# Patient Record
Sex: Male | Born: 1975 | Race: White | Hispanic: No | Marital: Married | Smoking: Current some day smoker
Health system: Southern US, Community
[De-identification: ages and names within clinical notes are randomized; demographics above are authoritative.]

## PROBLEM LIST (undated history)

## (undated) DIAGNOSIS — F3160 Bipolar disorder, current episode mixed, unspecified: Secondary | ICD-10-CM

## (undated) DIAGNOSIS — K219 Gastro-esophageal reflux disease without esophagitis: Secondary | ICD-10-CM

## (undated) DIAGNOSIS — N200 Calculus of kidney: Secondary | ICD-10-CM

## (undated) DIAGNOSIS — K259 Gastric ulcer, unspecified as acute or chronic, without hemorrhage or perforation: Secondary | ICD-10-CM

## (undated) DIAGNOSIS — J449 Chronic obstructive pulmonary disease, unspecified: Secondary | ICD-10-CM

## (undated) DIAGNOSIS — M1990 Unspecified osteoarthritis, unspecified site: Secondary | ICD-10-CM

## (undated) HISTORY — DX: Gastro-esophageal reflux disease without esophagitis: K21.9

## (undated) HISTORY — DX: Calculus of kidney: N20.0

## (undated) HISTORY — PX: OTHER SURGICAL HISTORY: SHX169

## (undated) HISTORY — DX: Unspecified osteoarthritis, unspecified site: M19.90

## (undated) HISTORY — DX: Bipolar disorder, current episode mixed, unspecified: F31.60

## (undated) HISTORY — DX: Gastric ulcer, unspecified as acute or chronic, without hemorrhage or perforation: K25.9

## (undated) HISTORY — DX: Chronic obstructive pulmonary disease, unspecified: J44.9

---

## 2003-11-26 DIAGNOSIS — F3160 Bipolar disorder, current episode mixed, unspecified: Secondary | ICD-10-CM

## 2003-11-26 HISTORY — DX: Bipolar disorder, current episode mixed, unspecified: F31.60

## 2012-04-27 ENCOUNTER — Ambulatory Visit (INDEPENDENT_AMBULATORY_CARE_PROVIDER_SITE_OTHER): Payer: Medicare Other | Admitting: Physician Assistant

## 2012-04-27 ENCOUNTER — Encounter: Payer: Self-pay | Admitting: Physician Assistant

## 2012-04-27 VITALS — BP 141/86 | HR 67 | Temp 98.6°F | Ht 69.5 in | Wt 124.0 lb

## 2012-04-27 DIAGNOSIS — M1990 Unspecified osteoarthritis, unspecified site: Secondary | ICD-10-CM

## 2012-04-27 DIAGNOSIS — R0602 Shortness of breath: Secondary | ICD-10-CM

## 2012-04-27 DIAGNOSIS — K219 Gastro-esophageal reflux disease without esophagitis: Secondary | ICD-10-CM

## 2012-04-27 DIAGNOSIS — R0789 Other chest pain: Secondary | ICD-10-CM

## 2012-04-27 DIAGNOSIS — M129 Arthropathy, unspecified: Secondary | ICD-10-CM

## 2012-04-27 MED ORDER — OMEPRAZOLE 20 MG PO CPDR: 20.0000 mg | DELAYED_RELEASE_CAPSULE | Freq: Every day | ORAL | Status: DC

## 2012-04-27 MED ORDER — ALBUTEROL SULFATE HFA 108 (90 BASE) MCG/ACT IN AERS: 2.0000 | INHALATION_SPRAY | Freq: Four times a day (QID) | RESPIRATORY_TRACT | Status: DC | PRN

## 2012-04-27 NOTE — Patient Instructions (Addendum)
Refilled rescue inhaler and prilosec. Will schedule for stress test. Restart Gabapentin for nerve pain. Schedule an appt for spirometry. Reconsider going back on medications for bipolar. Consider tylenol for pain along with ice. Glucosamine Chondrotin for joint pain. Pain management referral.

## 2012-04-28 ENCOUNTER — Encounter: Payer: Self-pay | Admitting: Physician Assistant

## 2012-04-28 DIAGNOSIS — M1990 Unspecified osteoarthritis, unspecified site: Secondary | ICD-10-CM | POA: Insufficient documentation

## 2012-04-28 DIAGNOSIS — K219 Gastro-esophageal reflux disease without esophagitis: Secondary | ICD-10-CM | POA: Insufficient documentation
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
## 2012-05-01 ENCOUNTER — Encounter: Payer: Self-pay | Admitting: *Deleted

## 2012-05-04 ENCOUNTER — Other Ambulatory Visit: Payer: Medicare Other | Admitting: Physician Assistant

## 2012-05-04 ENCOUNTER — Encounter: Payer: Self-pay | Admitting: Physician Assistant

## 2012-05-04 ENCOUNTER — Ambulatory Visit (INDEPENDENT_AMBULATORY_CARE_PROVIDER_SITE_OTHER): Payer: Medicare Other | Admitting: Physician Assistant

## 2012-05-04 VITALS — BP 138/80 | HR 61 | Ht 69.5 in | Wt 125.0 lb

## 2012-05-04 DIAGNOSIS — K219 Gastro-esophageal reflux disease without esophagitis: Secondary | ICD-10-CM

## 2012-05-04 DIAGNOSIS — R0602 Shortness of breath: Secondary | ICD-10-CM

## 2012-05-04 DIAGNOSIS — M6289 Other specified disorders of muscle: Secondary | ICD-10-CM

## 2012-05-04 DIAGNOSIS — Z23 Encounter for immunization: Secondary | ICD-10-CM

## 2012-05-04 DIAGNOSIS — F411 Generalized anxiety disorder: Secondary | ICD-10-CM

## 2012-05-04 DIAGNOSIS — F17200 Nicotine dependence, unspecified, uncomplicated: Secondary | ICD-10-CM

## 2012-05-04 DIAGNOSIS — R0789 Other chest pain: Secondary | ICD-10-CM

## 2012-05-04 DIAGNOSIS — F419 Anxiety disorder, unspecified: Secondary | ICD-10-CM

## 2012-05-04 LAB — PULMONARY FUNCTION TEST

## 2012-05-04 MED ORDER — OMEPRAZOLE 40 MG PO CPDR: 40.0000 mg | DELAYED_RELEASE_CAPSULE | Freq: Every day | ORAL | Status: DC

## 2012-05-04 MED ORDER — CYCLOBENZAPRINE HCL 10 MG PO TABS: 10.0000 mg | ORAL_TABLET | Freq: Every evening | ORAL | Status: AC | PRN

## 2012-05-04 MED ORDER — SERTRALINE HCL 50 MG PO TABS: 50.0000 mg | ORAL_TABLET | Freq: Every day | ORAL | Status: DC

## 2012-05-04 NOTE — Patient Instructions (Addendum)
Will call with stress test. Start Zoloft 50mg  1/2 tab for 7 days then increase to 1 tab daily. Gave flexeril for muscle relaxation to take a bedtime.  Gave Tdap. Will wait on labs until we get most recent labs from [facility] or you bring [place] copy to scan in. We needs these or we will need to order new labs.  Go ahead a stop Gabapentin since not helping pain and making chest pain worse.
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
## 2012-05-06 ENCOUNTER — Other Ambulatory Visit: Payer: Self-pay | Admitting: Physician Assistant

## 2012-05-06 MED ORDER — OMEPRAZOLE 20 MG PO CPDR: 20.0000 mg | DELAYED_RELEASE_CAPSULE | Freq: Two times a day (BID) | ORAL | Status: DC

## 2012-05-06 NOTE — Progress Notes (Signed)
Let patient know that insurance wanted 20mg  twice a day instead of 40mg  daily. Sent to pharmacy.

## 2012-05-14 ENCOUNTER — Encounter: Payer: Self-pay | Admitting: Physician Assistant

## 2012-05-22 ENCOUNTER — Ambulatory Visit (INDEPENDENT_AMBULATORY_CARE_PROVIDER_SITE_OTHER): Payer: Medicare Other | Admitting: Physician Assistant

## 2012-05-22 ENCOUNTER — Encounter: Payer: Self-pay | Admitting: Physician Assistant

## 2012-05-22 DIAGNOSIS — R0789 Other chest pain: Secondary | ICD-10-CM

## 2012-05-22 NOTE — Procedures (Signed)
Manuel Cortez. is a 36 y.o. male smoker with a strong FHx of CAD (dad with MI in 58s) referred by PCP for ETT.  Has had atypical CP for 2 years.  Exam unremarkable.     Exercise Treadmill Test  Pre-Exercise Testing Evaluation Rhythm: normal sinus  Rate: 65   PR:  .13 QRS:  .09  QT:  .40 QTc: .41     Test  Exercise Tolerance Test Ordering MD: Tandy Gaw , PA-C  Interpreting MD: Tereso Newcomer PA-C  Unique Test No: 1  Treadmill:  1  Indication for ETT: chest pain - rule out ischemia  Contraindication to ETT: No   Stress Modality: exercise - treadmill  Cardiac Imaging Performed: non   Protocol: standard Bruce - maximal  Max BP: 164/92  Max MPHR (bpm):  183 85% MPR (bpm):  155  MPHR obtained (bpm):  109 % MPHR obtained:  61%  Reached 85% MPHR (min:sec):  n/a Total Exercise Time (min-sec):  6:47  Workload in METS:  8.1 Borg Scale: 13  Reason ETT Terminated:  patient's desire to stop    ST Segment Analysis At Rest: normal ST segments - no evidence of significant ST depression With Exercise: no evidence of significant ST depression  Other Information Arrhythmia:  No Angina during ETT:  absent (0) Quality of ETT:  non-diagnostic  ETT Interpretation:  Patient unable to achieve target heart rate.  Comments: Poor exercise tolerance. No chest pain. Normal BP response to exercise. No ST-T changes at submaximal exercise.   Recommendations: Patient complained of back pain and was also unable to stay on treadmill safely during more advanced stages. This test is non-diagnostic. Would suggest stress imaging (Dobutamine Echo or Lexiscan Myoview) if felt to be clinically indicated. Tereso Newcomer, PA-C  9:18 AM 05/22/2012

## 2012-05-25 ENCOUNTER — Other Ambulatory Visit: Payer: Self-pay | Admitting: Physician Assistant

## 2012-05-25 DIAGNOSIS — R079 Chest pain, unspecified: Secondary | ICD-10-CM

## 2012-06-10 ENCOUNTER — Ambulatory Visit (INDEPENDENT_AMBULATORY_CARE_PROVIDER_SITE_OTHER): Payer: Medicare Other | Admitting: Physician Assistant

## 2012-06-10 ENCOUNTER — Encounter: Payer: Self-pay | Admitting: Physician Assistant

## 2012-06-10 VITALS — BP 139/81 | HR 68 | Ht 69.5 in | Wt 123.0 lb

## 2012-06-10 DIAGNOSIS — M2550 Pain in unspecified joint: Secondary | ICD-10-CM

## 2012-06-10 DIAGNOSIS — R0789 Other chest pain: Secondary | ICD-10-CM

## 2012-06-10 DIAGNOSIS — F411 Generalized anxiety disorder: Secondary | ICD-10-CM

## 2012-06-10 MED ORDER — ESCITALOPRAM OXALATE 10 MG PO TABS: 10.0000 mg | ORAL_TABLET | Freq: Every day | ORAL | Status: DC

## 2012-06-10 MED ORDER — HYDROXYZINE HCL 25 MG PO TABS: 25.0000 mg | ORAL_TABLET | Freq: Three times a day (TID) | ORAL | Status: DC | PRN

## 2012-06-10 MED ORDER — DULOXETINE HCL 30 MG PO CPEP: 30.0000 mg | ORAL_CAPSULE | Freq: Every day | ORAL | Status: DC

## 2012-06-10 NOTE — Patient Instructions (Addendum)
Do not pick up Lexapro.   Pick up cymbalta 30 mg daily. Follow up in 1 month. Try hydroxine when chest pain starts and see if helps calm it down can use up to three times a day.  Will refer to cardiology.

## 2012-06-11 LAB — RHEUMATOID FACTOR: Rhuematoid fact SerPl-aCnc: <10 IU/mL (ref <=14)

## 2012-06-11 LAB — SEDIMENTATION RATE: Sed Rate: 1 mm/hr (ref 0–16)

## 2012-06-12 NOTE — Progress Notes (Signed)
  Subjective:    Patient ID: Manuel Cortez., male    DOB: 07-Jun-1976, 36 y.o.   MRN: [id number]  HPI Pt presents to clinic to follow up on anxiety/atypical chest pains. He stopped taking Zoloft because he didn't like the way it made him feel. Stress test was attempted but patient unable to reach target heart rate. His lungs have been fully evaluated and normal. Pt continues to complain of stabbing pain on the left side of chest with SOB at rest and with exertion. The patient reports the only thing that helps it is alcohol. Denies any numbness or tingling of arms.    Review of Systems     Objective:   Physical Exam  Constitutional: He is oriented to person, place, and time. He appears well-developed and well-nourished.  HENT:  Head: Normocephalic and atraumatic.  Cardiovascular: Normal rate, regular rhythm and normal heart sounds.   Pulmonary/Chest: Effort normal and breath sounds normal. He has no wheezes.  Neurological: He is alert and oriented to person, place, and time.  Skin: Skin is warm and dry.  Psychiatric:       Anxious. States the same thing over and over.          Assessment & Plan:  Anxiety/Atypical Chest pains- I do think this is related to his anxiety. I will refer to cardiologist to see if he would suggest that we do anything else to evaluate his heart. I do not want to give benzo because I think he might have a problem with the additive component of them hence he will not give up marijuana to go to pain clinic. I did give Hydroxyzine for anxiety up to three times a day and cymbalta 30mg  daily. I orginally wanted to try lexapro but reminded patient not to pick that up from pharmacy I only wanted him on cymbalta. Will recheck in 6 weeks. Reassured patient that if everything check out at cardiology that we really needed to focus on mood/anxiety.

## 2012-06-16 ENCOUNTER — Encounter: Payer: Self-pay | Admitting: Family Medicine

## 2012-06-16 ENCOUNTER — Ambulatory Visit (INDEPENDENT_AMBULATORY_CARE_PROVIDER_SITE_OTHER): Payer: Medicare Other | Admitting: Family Medicine

## 2012-06-16 VITALS — BP 124/78 | HR 68 | Temp 98.0°F | Wt 126.0 lb

## 2012-06-16 DIAGNOSIS — M549 Dorsalgia, unspecified: Secondary | ICD-10-CM

## 2012-06-16 DIAGNOSIS — M546 Pain in thoracic spine: Secondary | ICD-10-CM

## 2012-06-16 DIAGNOSIS — F319 Bipolar disorder, unspecified: Secondary | ICD-10-CM

## 2012-06-16 NOTE — Progress Notes (Signed)
  Subjective:    Patient ID: Claretha Cooper., male    DOB: [birth year], 36 y.o.   MRN: [id number]  HPI Says was in the pool with the kids yesterday.  Woke up with left mid back pain. Can walk ok.  Pain is 8/10.  Worse with bending to the side.  Feels like a pressure and tightness.  Has had back pain before. No radiation of pain.  Took a half a vicodin for pain relief.  ( not his rx). Says NSAID cause stomach burning. Says he is no longer taking the cymbalta.    Review of Systems     Objective:   Physical Exam  Constitutional: He appears well-developed and well-nourished.  Musculoskeletal:       Dec flexion, normal extension, normal rotattion right and left.  Pain with side benign to the right.   Skin: Skin is warm and dry.  Psychiatric: He has a normal mood and affect. His behavior is normal.          Assessment & Plan:  Left mid back pain - Can use his volteran gel since has sensitivity to oral NSAIDs.  Offered toradol injection but he declined.  Can use oral Tylenol. Use heating pad for 10-15 at a time for muscle  Relaxtion.  Consider restarting the cymbalta since intoleratn to volteran. Given H.O for stretches.  Avoid laying in bed all day.  F/U in 3-4 weeks if needed.

## 2012-06-16 NOTE — Patient Instructions (Signed)
Mid-Back Strain  with Rehab    A strain is an injury in which a tendon or muscle is torn. The muscles and tendons of the mid-back are vulnverable to strains. However, these muscles and tendons are very strong and require a great force to be injured. The muscles of the mid-back are responsible for stabilizing the spinal column, as well as spinal twisting (rotation). Strains are classified into three categories. Grade 1 strains cause pain, but the tendon is not lengthened. Grade 2 strains include a lengthened ligament, due to the ligament being stretched or partially ruptured. With grade 2 strains there is still function, although the function may be decreased. Grade 3 strains involve a complete tear of the tendon or muscle, and function is usually impaired.  SYMPTOMS    · Pain in the middle of the back.  · Pain that may affect only one side, and is worse with movement.  · Muscle spasms, and often swelling in the back.  · Loss of strength of the back muscles.  · Crackling sound (crepitation) when the muscles are touched.  CAUSES    Mid-back strains occur when a force is placed on the muscles or tendons that is greater than they can handle. Common causes of injury include:  · Ongoing overuse of the muscle-tendon units in the middle back, usually from incorrect body posture.  · A single violent injury or force applied to the back.  RISK INCREASES WITH:  · Sports that involve twisting forces on the spine or a lot of bending at the waist (football, rugby, weightlifting, bowling, golf, tennis, speed skating, racquetball, swimming, running, gymnastics, diving).  · Poor strength and flexibility.  · Failure to warm up properly before activity.  · Family history of low back pain or disk disorders.  · Previous back injury or surgery (especially fusion).  PREVENTION  · Learn and use proper sports technique.  · Warm up and stretch properly before activity.  · Allow for adequate recovery between workouts.  · Maintain physical  fitness:  · Strength, flexibility, and endurance.  · Cardiovascular fitness.  PROGNOSIS    If treated properly, mid-back strains usually heal within 6 weeks.  RELATED COMPLICATIONS    · Frequently recurring symptoms, resulting in a chronic problem. Properly treating the problem the first time decreases frequency of recurrence.  · Chronic inflammation, scarring, and partial muscle-tendon tear.  · Delayed healing or resolution of symptoms, especially if activity is resumed too soon.  · Prolonged disability.  TREATMENT  Treatment first involves the use of ice and medicine, to reduce pain and inflammation. As the pain begins to subside, you may begin strengthening and stretching exercises to improve body posture and sport technique. These exercises may be performed at home or with a therapist. Severe injuries may require referral to a therapist for further evaluation and treatment, such as ultrasound. Corticosteroid injections may be given to help reduce inflammation. Biofeedback (watching monitors of your body processes) and psychotherapy may also be prescribed. Prolonged bed rest is felt to do more harm than good. Massage may help break the muscle spasms. Sometimes, an injection of cortisone, with or without local anesthetics, may be given to help relieve the pain and spasms.  MEDICATION    · If pain medicine is needed, nonsteroidal anti-inflammatory medicines (aspirin and ibuprofen), or other minor pain relievers (acetaminophen), are often advised.  · Do not take pain medicine for 7 days before surgery.  · Prescription pain relievers may be given, if your caregiver   thinks they are needed. Use only as directed and only as much as you need.  · Ointments applied to the skin may be helpful.  · Corticosteroid injections may be given by your caregiver. These injections should be reserved for the most serious cases, because they may only be given a certain number of times.  HEAT AND COLD:    · Cold treatment (icing) should  be applied for 10 to 15 minutes every 2 to 3 hours for inflammation and pain, and immediately after activity that aggravates your symptoms. Use ice packs or an ice massage.  · Heat treatment may be used before performing stretching and strengthening activities prescribed by your caregiver, physical therapist, or athletic trainer. Use a heat pack or a warm water soak.  SEEK IMMEDIATE MEDICAL CARE IF:  · Symptoms get worse or do not improve in 2 to 4 weeks, despite treatment.  · You develop numbness, weakness, or loss of bowel or bladder function.  · New, unexplained symptoms develop. (Drugs used in treatment may produce side effects.)  EXERCISES  RANGE OF MOTION (ROM) AND STRETCHING EXERCISES - Mid-Back Strain  These exercises may help you when beginning to rehabilitate your injury. In order to successfully resolve your symptoms, you must improve your posture. These exercises are designed to help reduce the forward-head and rounded-shoulder posture which contributes to this condition. Your symptoms may resolve with or without further involvement from your physician, physical therapist or athletic trainer. While completing these exercises, remember:    · Restoring tissue flexibility helps normal motion to return to the joints. This allows healthier, less painful movement and activity.  · An effective stretch should be held for at least 30 seconds.  · A stretch should never be painful. You should only feel a gentle lengthening or release in the stretched tissue.  STRETECH - Axial Extension  · Stand or sit on a firm surface. Assume a good posture: chest up, shoulders drawn back, stomach muscles slightly tense, knees unlocked (if standing) and feet hip width apart.  · Slowly retract your chin, so your head slides back and your chin slightly lowers. Continue to look straight ahead.  · You should feel a gentle stretch in the back of your head. Be certain not to feel an aggressive stretch since this can cause headaches  later.  · Hold for __________ seconds.  Repeat __________ times. Complete this exercise __________ times per day.  RANGE OF MOTION- Upper Thoracic Extension  · Sit on a firm chair with a high back. Assume a good posture: chest up, shoulders drawn back, abdominal muscles slightly tense, and feet hip width apart. Place a small pillow or folded towel in the curve of your lower back, if you are having difficulty maintaining good posture.  · Gently brace your neck with your hands, allowing your arms to rest on your chest.  · Continue to support your neck and slowly extend your back over the chair. You will feel a stretch across your upper back.  · Hold __________ seconds. Slowly return to the starting position.  Repeat __________ times. Complete this exercise __________ times per day.  RANGE OF MOTION- Mid-Thoracic Extension  · Roll a towel so that it is about 4 inches in diameter.  · Position the towel lengthwise. Lay on the towel so that your spine, but not your shoulder blades, are supported.  · You should feel your mid-back arching toward the floor. To increase the stretch, extend your arms away from your body.  ·   you when beginning to rehabilitate your injury. They may resolve your symptoms with or without further involvement from your physician, physical therapist or athletic trainer. While completing these exercises, remember:    Muscles can gain both the endurance and the strength needed for everyday activities through controlled exercises.   Complete these exercises as instructed by your physician, physical therapist or athletic trainer. Increase the resistance and repetitions only as guided by your caregiver.   You may experience muscle soreness or fatigue, but the pain or discomfort you are trying to  eliminate should never worsen during these exercises. If this pain does worsen, stop and make certain you are following the directions exactly. If the pain is still present after adjustments, discontinue the exercise until you can discuss the trouble with your caregiver.  STRENGTHENING - Quadruped, Opposite UE/LE Lift  Assume a hands and knees position on a firm surface. Keep your hands under your shoulders and your knees under your hips. You may place padding under your knees for comfort.   Find your neutral spine and gently tense your abdominal muscles so that you can maintain this position. Your shoulders and hips should form a rectangle that is parallel with the floor and is not twisted.   Keeping your trunk steady, lift your right hand no higher than your shoulder and then your left leg no higher than your hip. Make sure you are not holding your breath. Hold this position __________ seconds.   Continuing to keep your abdominal muscles tense and your back steady, slowly return to your starting position. Repeat with the opposite arm and leg.  Repeat __________ times. Complete this exercise __________ times per day.   STRENGTH - Shoulder Extensors  Secure a rubber exercise band or tubing to a fixed object (table, pole) so that it is at the height of your shoulders when you are either standing, or sitting on a firm armless chair.   With a thumbs-up grip, grasp an end of the band in each hand. Straighten your elbows and lift your hands straight in front of you at shoulder height. Step back away from the secured end of band, until it becomes tense.   Squeezing your shoulder blades together, pull your hands down to the sides of your thighs. Do not allow your hands to go behind you.   Hold for __________ seconds. Slowly ease the tension on the band, as you reverse the directions and return to the starting position.  Repeat __________ times. Complete this exercise __________ times per day.     STRENGTH - Horizontal Abductors Choose one of the two positions to complete this exercise. Prone: lying on stomach:  Lie on your stomach on a firm surface so that your right / left arm overhangs the edge. Rest your forehead on your opposite forearm. With your palm facing the floor and your elbow straight, hold a __________ weight in your hand.   Squeeze your right / left shoulder blade to your mid-back spine and then slowly raise your arm to the height of the bed.   Hold for __________ seconds. Slowly reverse the directions and return to the starting position, controlling the weight as you lower your arm.  Repeat __________ times. Complete this exercise __________ times per day. Standing:   Secure a rubber exercise band or tubing, so that it is at the height of your shoulders when you are either standing, or sitting on a firm armless chair.   Grasp an end of the band in each  hand and have your palms face each other. Straighten your elbows and lift your hands straight in front of you at shoulder height. Step back away from the secured end of band, until it becomes tense.   Squeeze your shoulder blades together. Keeping your elbows locked and your hands at shoulder height, spread your arms apart, forming a "T" shape with your body. Hold __________ seconds. Slowly ease the tension on the band, as you reverse the directions and return to the starting position.  Repeat __________ times. Complete this exercise __________ times per day. STRENGTH - Scapular Retractors and External Rotators, Rowing  Secure a rubber exercise band or tubing, so that it is at the height of your shoulders when you are either standing, or sitting on a firm armless chair.   With a palm-down grip, grasp an end of the band in each hand. Straighten your elbows and lift your hands straight in front of you at shoulder height. Step back away from the secured end of band, until it becomes tense.   Step 1: Squeeze your shoulder  blades together. Bending your elbows, draw your hands to your chest as if you are rowing a boat. At the end of this motion, your hands and elbow should be at shoulder height and your elbows should be out to your sides.   Step 2: Rotate your shoulder to raise your hands above your head. Your forearms should be vertical and your upper arms should be horizontal.   Hold for __________ seconds. Slowly ease the tension on the band, as you reverse the directions and return to the starting position.  Repeat __________ times. Complete this exercise __________ times per day.   POSTURE AND BODY MECHANICS CONSIDERATIONS - Mid-Back Strain Keeping correct posture when sitting, standing or completing your activities will reduce the stress put on different body tissues, allowing injured tissues a chance to heal and limiting painful experiences. The following are general guidelines for improved posture. Your physician or physical therapist will provide you with any instructions specific to your needs. While reading these guidelines, remember:  The exercises prescribed by your provider will help you have the flexibility and strength to maintain correct postures.   The correct posture provides the best environment for your joints to work. All of your joints have less wear and tear when properly supported by a spine with good posture. This means you will experience a healthier, less painful body.   Correct posture must be practiced with all of your activities, especially prolonged sitting and standing. Correct posture is as important when doing repetitive low-stress activities (typing) as it is when doing a single heavy-load activity (lifting).  PROPER SITTING POSTURE In order to minimize stress and discomfort on your spine, you must sit with correct posture. Sitting with good posture should be effortless for a healthy body. Returning to good posture is a gradual process. Many people can work toward this most comfortably  by using various supports until they have the flexibility and strength to maintain this posture on their own. When sitting with proper posture, your ears will fall over your shoulders and your shoulders will fall over your hips. You should use the back of the chair to support your upper back. Your lower back will be in a neutral position, just slightly arched. You may place a small pillow or folded towel at the base of your low back for   support.   When working at a desk, create an environment that supports good, upright  posture. Without extra support, muscles fatigue and lead to excessive strain on joints and other tissues. Keep these recommendations in mind: CHAIR:  A chair should be able to slide under your desk when your back makes contact with the back of the chair. This allows you to work closely.   The chair's height should allow your eyes to be level with the upper part of your monitor and your hands to be slightly lower than your elbows.  BODY POSITION  Your feet should make contact with the floor. If this is not possible, use a foot rest.   Keep your ears over your shoulders. This will reduce stress on your neck and lower back.  INCORRECT SITTING POSTURES If you are feeling tired and unable to assume a healthy sitting posture, do not slouch or slump. This puts excessive strain on your back tissues, causing more damage and pain. Healthier options include:  Using more support, like a lumbar pillow.   Switching tasks to something that requires you to be upright or walking.   Talking a brief walk.   Lying down to rest in a neutral-spine position.  CORRECT STANDING POSTURES Proper standing posture should be assumed with all daily activities, even if they only take a few moments, like when brushing your teeth. As in sitting, your ears should fall over your shoulders and your shoulders should fall over your hips. You should keep a slight tension in your abdominal muscles to brace your  spine. Your tailbone should point down to the ground, not behind your body, resulting in an over-extended swayback posture.   INCORRECT STANDING POSTURES Common incorrect standing postures include a forward head, locked knees, and an excessive swayback. WALKING Walk with an upright posture. Your ears, shoulders and hips should all line-up. CORRECT LIFTING TECHNIQUES DO :   Assume a wide stance. This will provide you more stability and the opportunity to get as close as possible to the object which you are lifting.   Tense your abdominals to brace your spine. Bend at the knees and hips. Keeping your back locked in a neutral-spine position, lift using your leg muscles. Lift with your legs, keeping your back straight.   Test the weight of unknown objects before attempting to lift them.   Try to keep your elbows locked down at your sides in order get the best strength from your shoulders when carrying an object.   Always ask for help when lifting heavy or awkward objects.  INCORRECT LIFTING TECHNIQUES DO NOT:   Lock your knees when lifting, even if it is a small object.   Bend and twist. Pivot at your feet or move your feet when needing to change directions.   Assume that you can safely pick up even a paperclip without proper posture.  Document Released: 11/11/2005 Document Revised: 10/31/2011 Document Reviewed: 02/23/2009 [facility] Patient Information 2012 [place], [place].

## 2012-07-01 ENCOUNTER — Encounter: Payer: Self-pay | Admitting: Cardiology

## 2012-07-01 ENCOUNTER — Ambulatory Visit (INDEPENDENT_AMBULATORY_CARE_PROVIDER_SITE_OTHER): Payer: Medicare Other | Admitting: Cardiology

## 2012-07-01 VITALS — BP 128/82 | HR 93 | Ht 69.0 in | Wt 122.0 lb

## 2012-07-01 DIAGNOSIS — F17200 Nicotine dependence, unspecified, uncomplicated: Secondary | ICD-10-CM

## 2012-07-01 DIAGNOSIS — Z720 Tobacco use: Secondary | ICD-10-CM

## 2012-07-01 DIAGNOSIS — R079 Chest pain, unspecified: Secondary | ICD-10-CM

## 2012-07-01 NOTE — Assessment & Plan Note (Signed)
Patient counseled on discontinuing. 

## 2012-07-01 NOTE — Assessment & Plan Note (Signed)
Chest pain is not consistent with cardiac etiology. Previous exercise treadmill showed no chest pain, normal blood pressure response and no ST changes. Note he did not achieve target heart rate but did exercise for 6 minutes and 47 seconds and reached a heart rate of 155. I do not feel further cardiac evaluation is indicated. Pain may be musculoskeletal. We reviewed lifestyle modification.

## 2012-07-01 NOTE — Patient Instructions (Addendum)
Your physician recommends that you schedule a follow-up appointment in: AS NEEDED  

## 2012-07-01 NOTE — Progress Notes (Signed)
HPI: 36 year old male for evaluation of chest pain. No prior cardiac history. Note patient had an exercise treadmill in June of 2013. This revealed: Poor exercise tolerance, but he did exercise for 6 minutes and 47 seconds. His peak heart rate was 155 with 85% predicted 183. No chest pain. Normal BP response to exercise. No ST-T changes at submaximal exercise. Patient did not achieve target heart rate and therefore study was felt to be nondiagnostic. Patient complains of constant chest pain for 2 years. It is in a point position in the left upper chest radiating to the back. It is described as a stabbing pain or like a rod in him. It increases with stress or smoking a cigarette but has not completely resolved in the past 2 years other than when sleeping. Not pleuritic, positional, exertional or related to food. Also dyspnea on exertion but no orthopnea, PND or pedal edema.  Current Outpatient Prescriptions  Medication Sig Dispense Refill  . albuterol (PROVENTIL HFA) 108 (90 BASE) MCG/ACT inhaler Inhale 2 puffs into the lungs every 6 (six) hours as needed for wheezing.  1 Inhaler  6  . diclofenac sodium (VOLTAREN) 1 % GEL Apply 1 application topically as needed.      Marland Kitchen omeprazole (PRILOSEC) 20 MG capsule Take 1 capsule (20 mg total) by mouth 2 (two) times daily.  60 capsule  6    No Known Allergies  Past Medical History  Diagnosis Date  . GERD (gastroesophageal reflux disease)   . Gastric ulcer 15 years  . Bipolar 1 disorder, mixed 2005    Not on meds.  . Arthritis   . COPD (chronic obstructive pulmonary disease)   . Nephrolithiasis     Past Surgical History  Procedure Date  . Lymph node removal     History   Social History  . Marital Status: Unknown    Spouse Name: N/A    Number of Children: 3  . Years of Education: N/A   Occupational History  .      Disability   Social History Main Topics  . Smoking status: Current Some Day Smoker -- 0.1 packs/day    Types: Cigarettes  .  Smokeless tobacco: Not on file  . Alcohol Use: 3.0 oz/week    5 Cans of beer per week  . Drug Use: Yes    Special: Marijuana  . Sexually Active: Not on file   Other Topics Concern  . Not on file   Social History Narrative  . No narrative on file    Family History  Problem Relation Age of Onset  . Stroke Mother   . Alcohol abuse Father   . Cancer Father   . Heart attack Father     MI in his 30s  . Depression Father   . Diabetes Father   . Hyperlipidemia Father   . Hypertension Father     ROS: multiple complaints including numbness in his extremities, pain in his neck, pain in his back but no fevers or chills, productive cough, hemoptysis, dysphasia, odynophagia, melena, hematochezia, dysuria, hematuria, rash, seizure activity, orthopnea, PND, pedal edema, claudication. Remaining systems are negative.  Physical Exam:  Blood pressure 128/82, pulse 93, height 5\' 9"  (1.753 m), weight 122 lb (55.339 kg).  General:  Well developed/well nourished in NAD Skin warm/dry Patient not depressed No peripheral clubbing Back-normal HEENT-normal/normal eyelids Neck supple/normal carotid upstroke bilaterally; no bruits; no JVD; no thyromegaly chest - CTA/ normal expansion CV - RRR/normal S1 and S2; no murmurs, rubs or  gallops;  PMI nondisplaced Abdomen -NT/ND, no HSM, no mass, + bowel sounds, no bruit 2+ femoral pulses, no bruits Ext-no edema, chords, 2+ DP Neuro-grossly nonfocal  ECG 04/30/2012-sinus bradycardia, cannot rule out prior septal infarct.

## 2012-07-13 ENCOUNTER — Telehealth: Payer: Self-pay | Admitting: *Deleted

## 2012-07-13 NOTE — Telephone Encounter (Signed)
Pt aware and will call back when he is clean

## 2012-07-13 NOTE — Telephone Encounter (Signed)
Remind patient that they will drug screen him regularly and if he is not willing to give up marijuana then we don't need to refer. Make sure he is willing to give up recreational drugs before referral.

## 2012-07-13 NOTE — Telephone Encounter (Signed)
Pt would like referral to pain clinic. Please advise.

## 2012-08-14 ENCOUNTER — Telehealth: Payer: Self-pay | Admitting: *Deleted

## 2012-08-14 NOTE — Telephone Encounter (Signed)
Pt's wife calls stating Pt has stopped smoking mariajuana and is ready for Pain clinic referral.

## 2012-08-14 NOTE — Telephone Encounter (Signed)
Sent for referral to pain clinic. They may need films. I do not have these from [facility] and any imaging since.

## 2012-08-28 ENCOUNTER — Other Ambulatory Visit: Payer: Self-pay | Admitting: Physician Assistant

## 2012-08-28 ENCOUNTER — Encounter: Payer: Self-pay | Admitting: Physician Assistant

## 2012-08-28 ENCOUNTER — Ambulatory Visit (INDEPENDENT_AMBULATORY_CARE_PROVIDER_SITE_OTHER): Payer: Medicare Other

## 2012-08-28 ENCOUNTER — Ambulatory Visit (INDEPENDENT_AMBULATORY_CARE_PROVIDER_SITE_OTHER): Payer: Medicare Other | Admitting: Physician Assistant

## 2012-08-28 DIAGNOSIS — M25569 Pain in unspecified knee: Secondary | ICD-10-CM

## 2012-08-28 DIAGNOSIS — M549 Dorsalgia, unspecified: Secondary | ICD-10-CM

## 2012-08-28 DIAGNOSIS — G8921 Chronic pain due to trauma: Secondary | ICD-10-CM

## 2012-08-28 DIAGNOSIS — M25519 Pain in unspecified shoulder: Secondary | ICD-10-CM

## 2012-08-28 DIAGNOSIS — Z79899 Other long term (current) drug therapy: Secondary | ICD-10-CM

## 2012-08-28 DIAGNOSIS — M542 Cervicalgia: Secondary | ICD-10-CM

## 2012-08-28 DIAGNOSIS — R0789 Other chest pain: Secondary | ICD-10-CM

## 2012-08-28 DIAGNOSIS — R202 Paresthesia of skin: Secondary | ICD-10-CM

## 2012-08-28 DIAGNOSIS — Z13220 Encounter for screening for lipoid disorders: Secondary | ICD-10-CM

## 2012-08-28 DIAGNOSIS — F319 Bipolar disorder, unspecified: Secondary | ICD-10-CM

## 2012-08-28 DIAGNOSIS — M25511 Pain in right shoulder: Secondary | ICD-10-CM

## 2012-08-28 DIAGNOSIS — Z131 Encounter for screening for diabetes mellitus: Secondary | ICD-10-CM

## 2012-08-28 DIAGNOSIS — R209 Unspecified disturbances of skin sensation: Secondary | ICD-10-CM

## 2012-08-28 DIAGNOSIS — R0602 Shortness of breath: Secondary | ICD-10-CM

## 2012-08-28 IMAGING — CR DG SHOULDER 2+V*R*
3 series · 3 of 3 positions shown · non-contrast
Comparison: None.

CLINICAL DATA: Pain

RIGHT SHOULDER - 2+ VIEW

[view not recorded (1 of 3)]
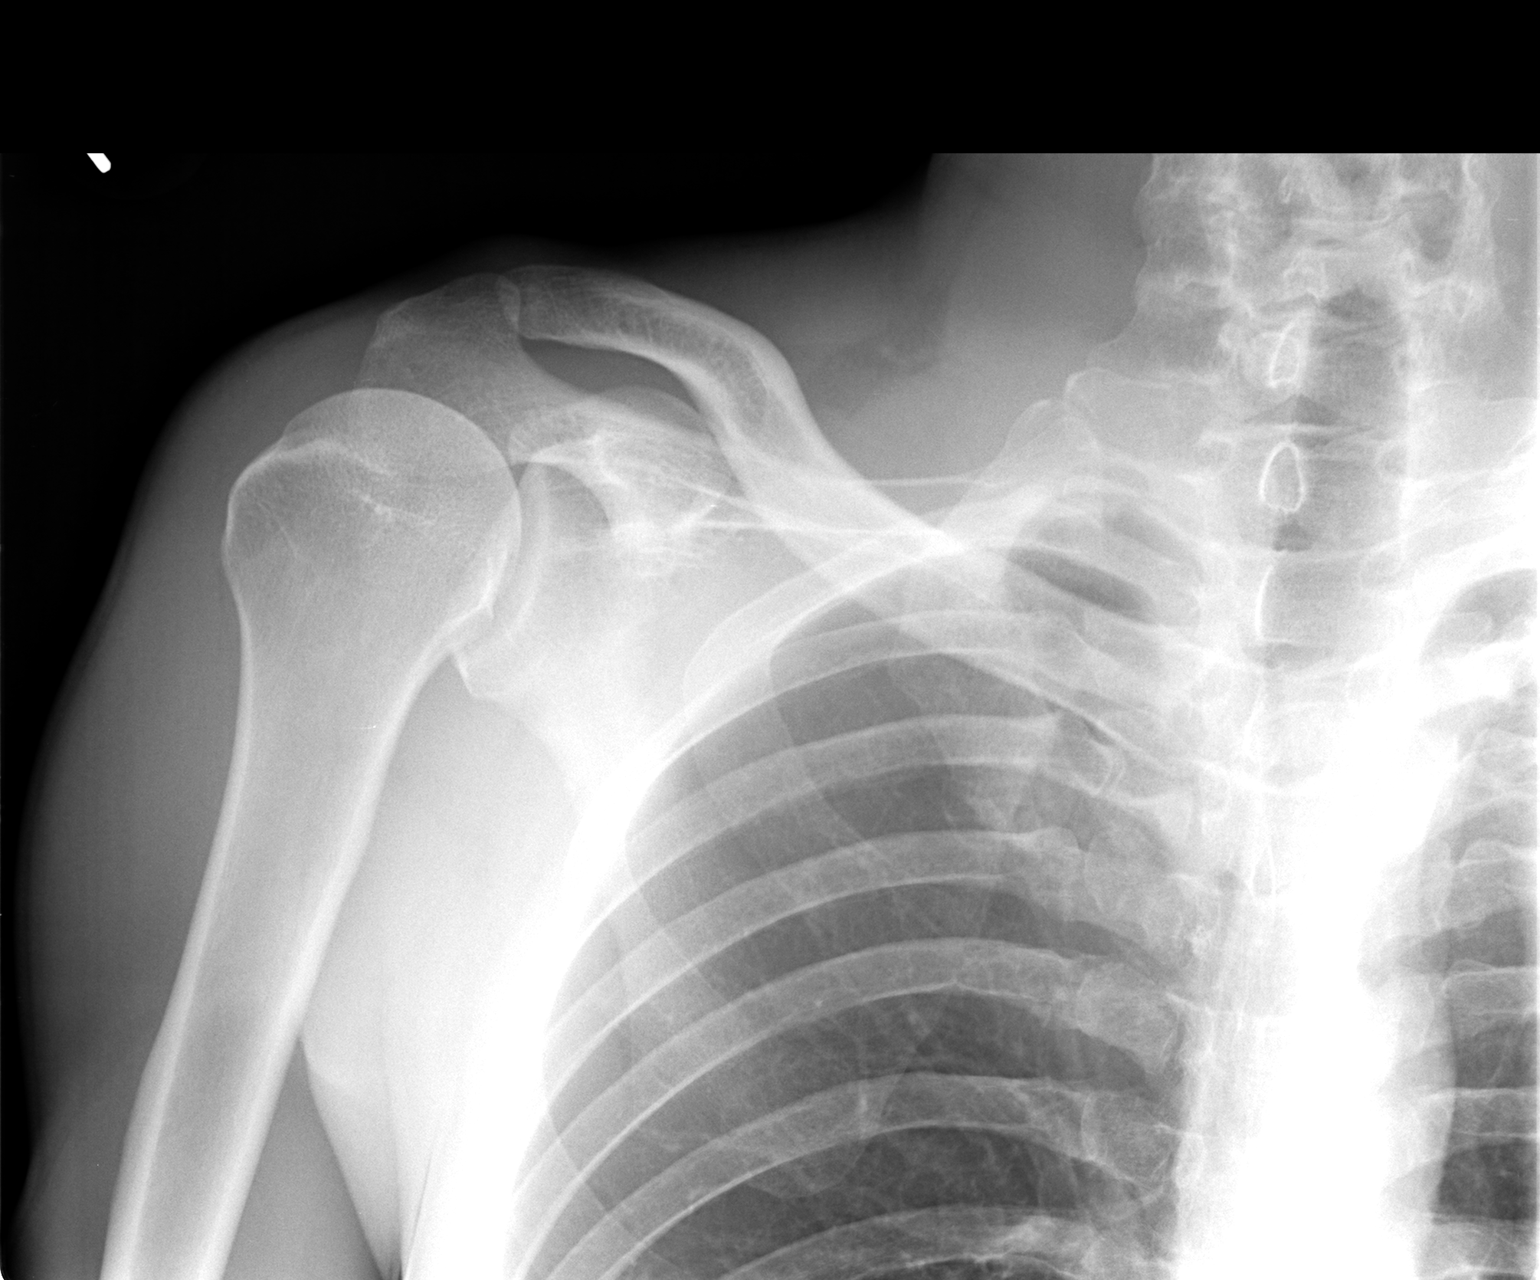

[view not recorded (2 of 3)]
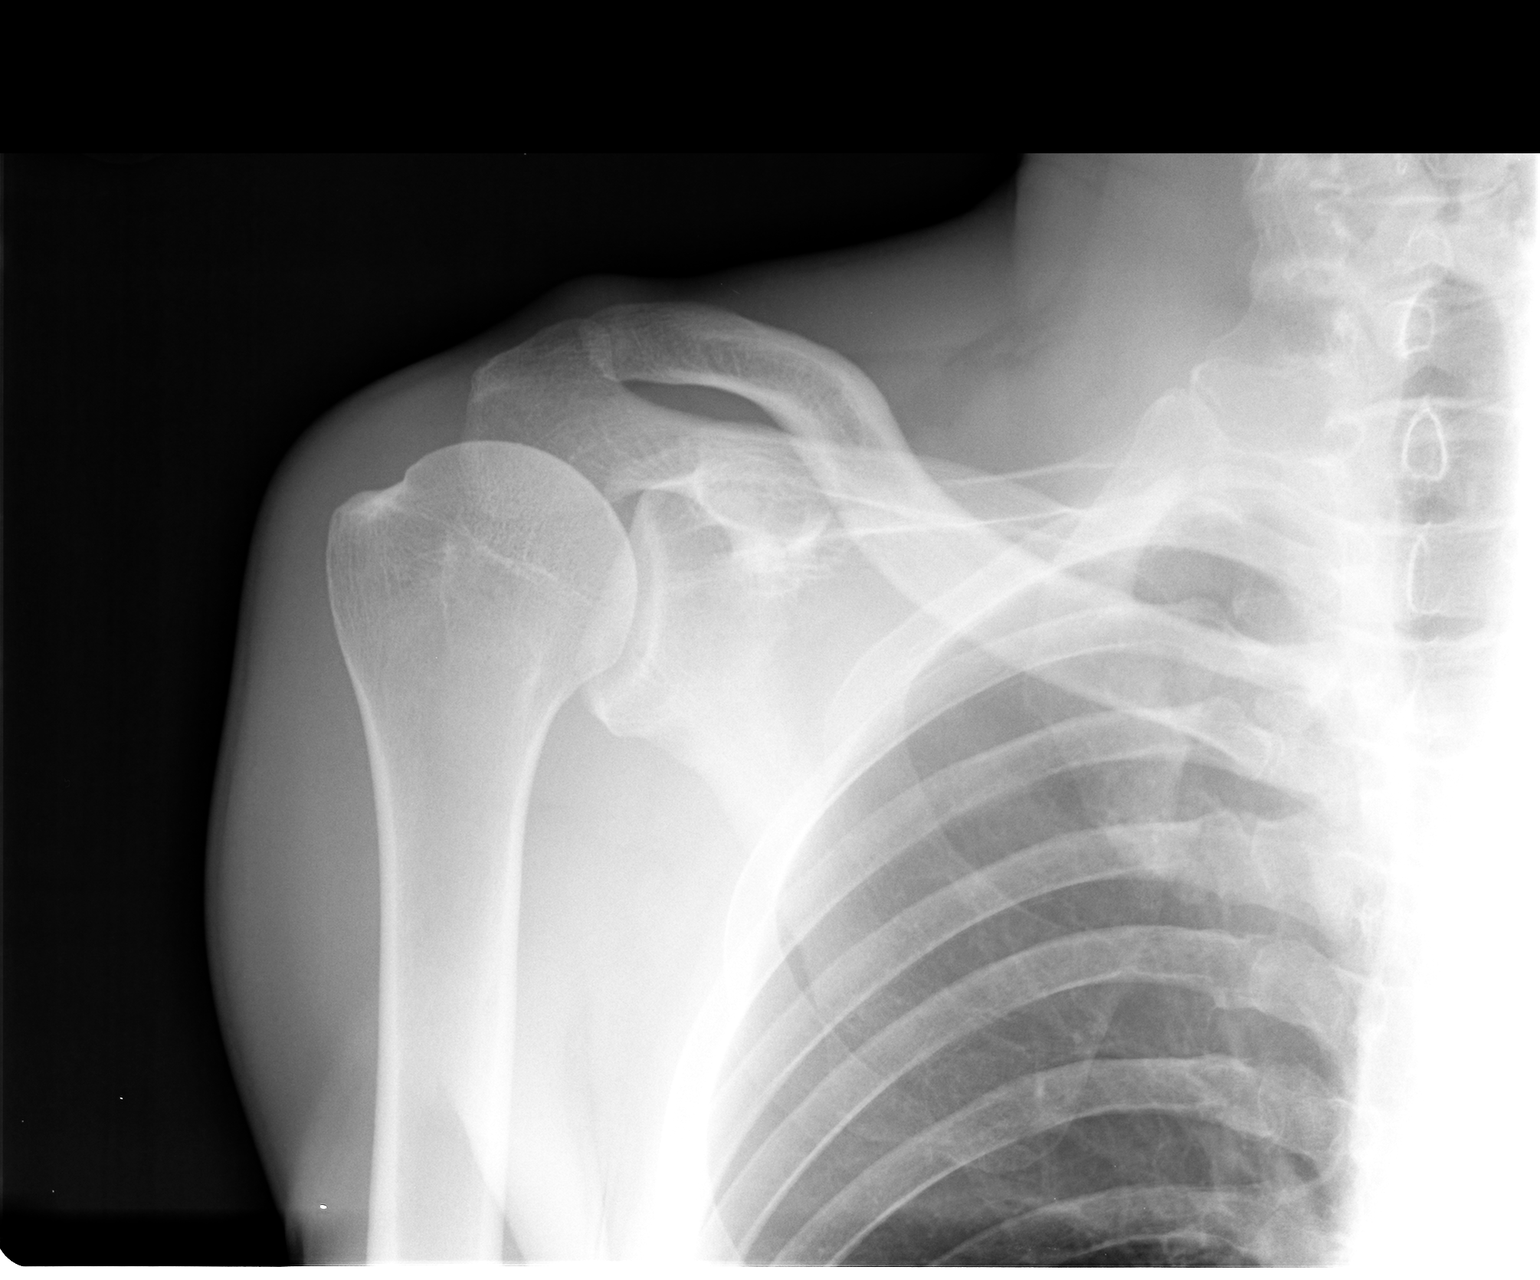

[view not recorded (3 of 3)]
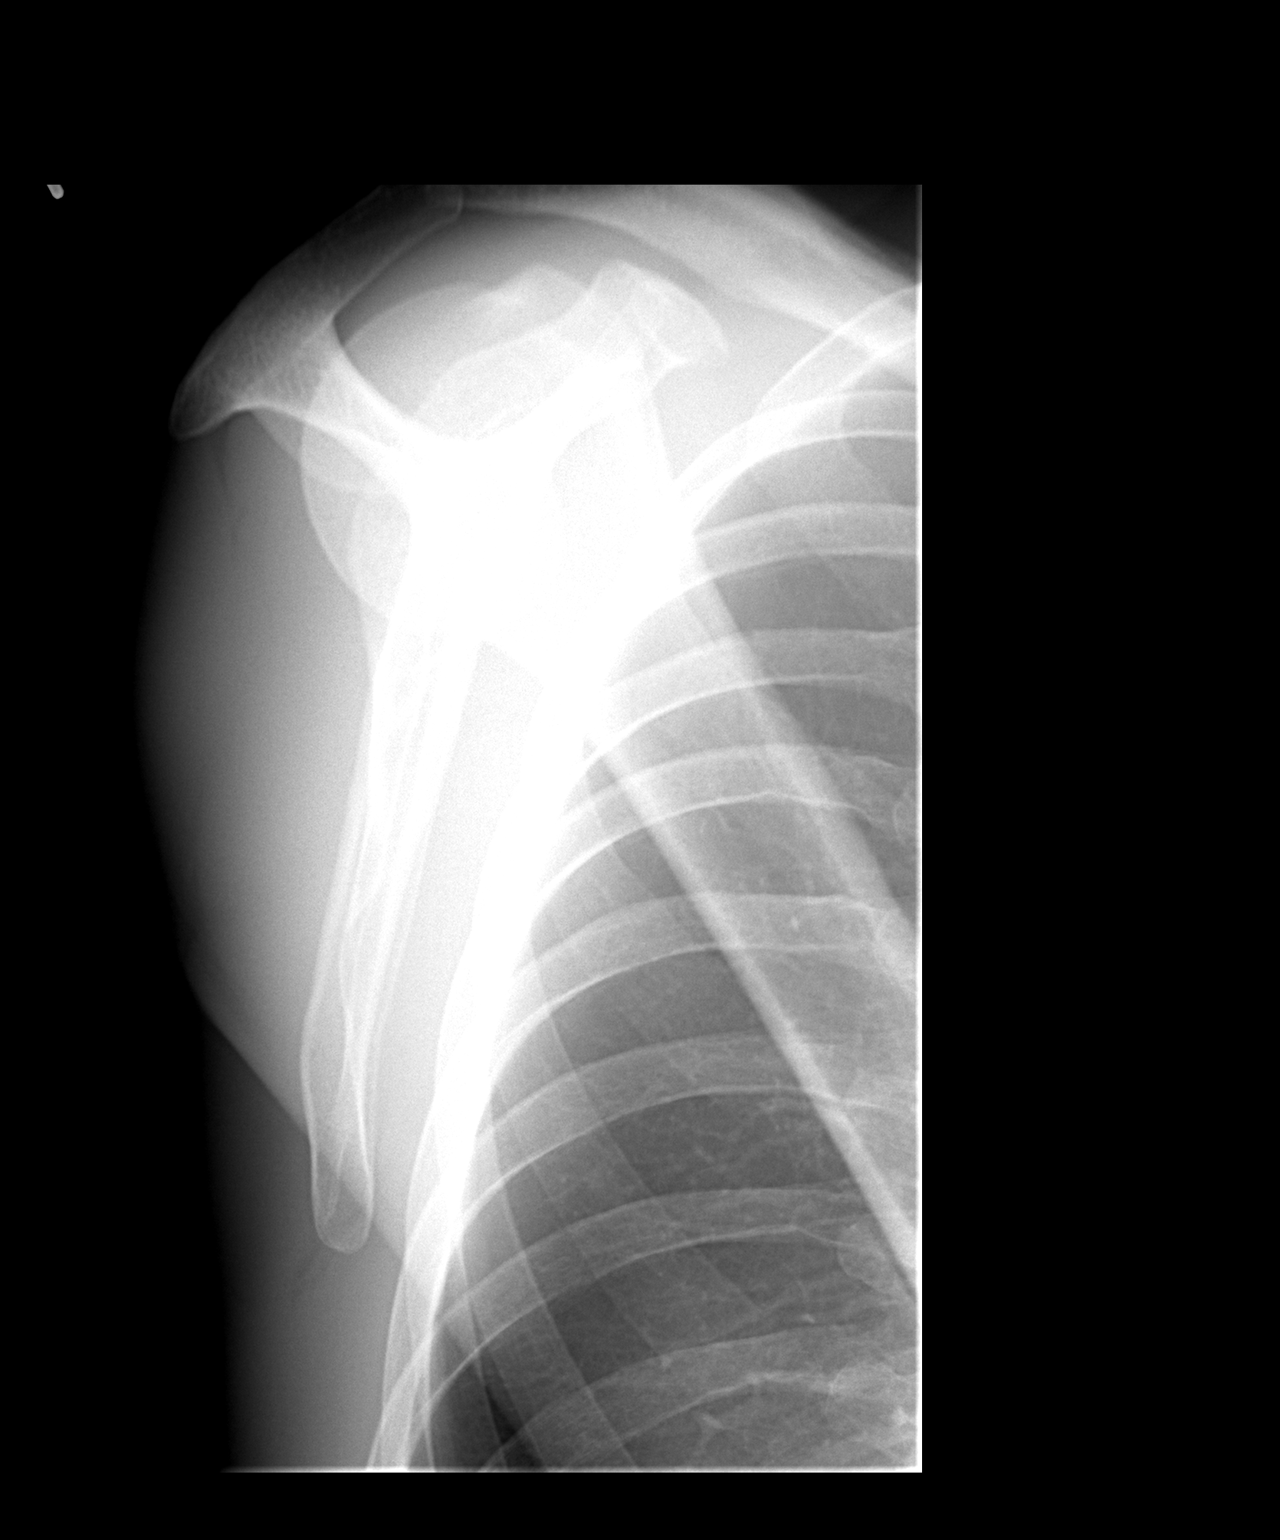

[3 of 3 positions shown; findings below may reference images not displayed]

FINDINGS: Three views of the right shoulder submitted.  No acute
fracture or subluxation.  Right AC joint is unremarkable.
IMPRESSION: No acute fracture or subluxation.

## 2012-08-28 MED ORDER — OLANZAPINE 5 MG PO TABS: 5.0000 mg | ORAL_TABLET | Freq: Every day | ORAL | Status: DC

## 2012-08-28 MED ORDER — TIOTROPIUM BROMIDE MONOHYDRATE 18 MCG IN CAPS: 18.0000 ug | ORAL_CAPSULE | Freq: Every day | RESPIRATORY_TRACT | Status: DC

## 2012-08-28 MED ORDER — CLONAZEPAM 0.25 MG PO TBDP: 0.2500 mg | ORAL_TABLET | Freq: Two times a day (BID) | ORAL | Status: DC | PRN

## 2012-08-28 NOTE — Patient Instructions (Addendum)
Gave spiriva to start taking daily for SOB and COPD symptoms.  Start zyprexa at night for bipolar. Will refer to psychiatry.   Will order MRI and xrays. Once I get results will consider consultation with Dr. Karie Schwalbe or continue with pain management referral.

## 2012-08-31 MED ORDER — BECLOMETHASONE DIPROPIONATE 40 MCG/ACT IN AERS: 2.0000 | INHALATION_SPRAY | Freq: Two times a day (BID) | RESPIRATORY_TRACT | Status: DC
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
## 2012-09-04 LAB — CBC WITH DIFFERENTIAL/PLATELET
Basophils Relative: 0 % (ref 0–1)
Eosinophils Absolute: 0.1 10*3/uL (ref 0.0–0.7)
Lymphs Abs: 1.8 10*3/uL (ref 0.7–4.0)
MCH: 32.3 pg (ref 26.0–34.0)
MCHC: 35.3 g/dL (ref 30.0–36.0)
Neutrophils Relative %: 59 % (ref 43–77)
Platelets: 194 10*3/uL (ref 150–400)
RBC: 4.71 MIL/uL (ref 4.22–5.81)

## 2012-09-04 LAB — LIPID PANEL
Cholesterol: 140 mg/dL (ref 0–200)
Total CHOL/HDL Ratio: 2 Ratio
Triglycerides: 52 mg/dL (ref <150)
VLDL: 10 mg/dL (ref 0–40)

## 2012-09-04 LAB — COMPREHENSIVE METABOLIC PANEL
BUN: 11 mg/dL (ref 6–23)
CO2: 27 mEq/L (ref 19–32)
Calcium: 9.5 mg/dL (ref 8.4–10.5)
Chloride: 105 mEq/L (ref 96–112)
Creat: 0.88 mg/dL (ref 0.50–1.35)
Glucose, Bld: 81 mg/dL (ref 70–99)
Total Bilirubin: 0.8 mg/dL (ref 0.3–1.2)

## 2012-09-07 ENCOUNTER — Other Ambulatory Visit: Payer: Self-pay | Admitting: Physician Assistant

## 2012-09-07 DIAGNOSIS — Z1389 Encounter for screening for other disorder: Secondary | ICD-10-CM

## 2012-09-08 ENCOUNTER — Ambulatory Visit (HOSPITAL_BASED_OUTPATIENT_CLINIC_OR_DEPARTMENT_OTHER)
Admission: RE | Admit: 2012-09-08 | Discharge: 2012-09-08 | Disposition: A | Discharge status: Home / Self Care | Payer: Medicare Other | Source: Ambulatory Visit | Attending: Physician Assistant | Admitting: Physician Assistant

## 2012-09-08 DIAGNOSIS — M542 Cervicalgia: Secondary | ICD-10-CM

## 2012-09-08 DIAGNOSIS — M4802 Spinal stenosis, cervical region: Secondary | ICD-10-CM | POA: Insufficient documentation

## 2012-09-08 DIAGNOSIS — R209 Unspecified disturbances of skin sensation: Secondary | ICD-10-CM | POA: Insufficient documentation

## 2012-09-08 DIAGNOSIS — G8921 Chronic pain due to trauma: Secondary | ICD-10-CM

## 2012-09-08 DIAGNOSIS — Z1389 Encounter for screening for other disorder: Secondary | ICD-10-CM

## 2012-09-08 DIAGNOSIS — R200 Anesthesia of skin: Secondary | ICD-10-CM

## 2012-09-10 ENCOUNTER — Encounter: Payer: Self-pay | Admitting: Sports Medicine

## 2012-09-10 ENCOUNTER — Ambulatory Visit (INDEPENDENT_AMBULATORY_CARE_PROVIDER_SITE_OTHER): Payer: Medicare Other | Admitting: Sports Medicine

## 2012-09-10 VITALS — BP 147/84 | HR 82 | Wt 121.0 lb

## 2012-09-10 DIAGNOSIS — M4802 Spinal stenosis, cervical region: Secondary | ICD-10-CM | POA: Insufficient documentation

## 2012-09-10 MED ORDER — PREDNISONE 50 MG PO TABS: ORAL_TABLET | ORAL | Status: DC
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
## 2012-09-10 NOTE — Assessment & Plan Note (Addendum)
His symptoms are fairly moderate, but not progressive. He has no current signs/symptoms of cervical myelopathy/myelomalacia. At this point I think that we should certainly try nonsurgical intervention in the form of an interlaminar epidural steroid injection, ideally at the C6-C7 level, as surgical intervention at this point would involve approximately a 4 level fusion. I'm also going to place him on prednisone for use for 5 days leading up to his ESI, and he has some Flexeril at home that he will take at bedtime. He will come back to see after his first epidural injection to see how things are going. At this point, I am going to hold off on formal physical therapy, until his symptoms are somewhat better.  Should he have any sign of progression including new onset bowel or bladder incontinence, saddle anesthesia, worsening gait then surgical intervention becomes more of an urgent priority.

## 2012-10-07 ENCOUNTER — Ambulatory Visit
Admission: RE | Admit: 2012-10-07 | Discharge: 2012-10-07 | Disposition: A | Discharge status: Home / Self Care | Payer: Medicare Other | Source: Ambulatory Visit | Attending: Sports Medicine | Admitting: Sports Medicine

## 2012-10-07 VITALS — BP 121/85 | HR 75 | Ht 69.5 in | Wt 128.0 lb

## 2012-10-07 DIAGNOSIS — M4802 Spinal stenosis, cervical region: Secondary | ICD-10-CM

## 2012-10-07 MED ORDER — TRIAMCINOLONE ACETONIDE 40 MG/ML IJ SUSP (RADIOLOGY)
60.0000 mg | Freq: Once | INTRAMUSCULAR | Status: AC
  Administered 2012-10-07: 60 mg via EPIDURAL

## 2012-10-07 MED ORDER — DIAZEPAM 5 MG PO TABS
10.0000 mg | ORAL_TABLET | Freq: Once | ORAL | Status: AC
  Administered 2012-10-07: 10 mg via ORAL

## 2012-10-07 MED ORDER — IOHEXOL 300 MG/ML  SOLN
1.0000 mL | Freq: Once | INTRAMUSCULAR | Status: AC | PRN
  Administered 2012-10-07: 1 mL via EPIDURAL

## 2012-10-07 NOTE — Progress Notes (Signed)
Dr. Carlota Raspberry in to explain the procedure at length to pt and his wife. Questions were answered at length.

## 2012-10-09 ENCOUNTER — Encounter: Payer: Self-pay | Admitting: Physician Assistant

## 2012-10-09 ENCOUNTER — Ambulatory Visit (INDEPENDENT_AMBULATORY_CARE_PROVIDER_SITE_OTHER): Payer: Medicare Other | Admitting: Physician Assistant

## 2012-10-09 VITALS — BP 142/85 | HR 77 | Ht 69.5 in | Wt 121.0 lb

## 2012-10-09 DIAGNOSIS — R41840 Attention and concentration deficit: Secondary | ICD-10-CM

## 2012-10-09 DIAGNOSIS — J449 Chronic obstructive pulmonary disease, unspecified: Secondary | ICD-10-CM | POA: Insufficient documentation

## 2012-10-09 DIAGNOSIS — Z23 Encounter for immunization: Secondary | ICD-10-CM

## 2012-10-09 DIAGNOSIS — F319 Bipolar disorder, unspecified: Secondary | ICD-10-CM

## 2012-10-09 DIAGNOSIS — M4802 Spinal stenosis, cervical region: Secondary | ICD-10-CM
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
## 2012-10-09 NOTE — Patient Instructions (Addendum)
Take 5 days of prednisone. Will get into formal Physical therapy. Will put in another psych evaluation.

## 2012-10-13 ENCOUNTER — Ambulatory Visit: Payer: Medicare Other | Attending: Family Medicine | Admitting: Physical Therapy

## 2012-10-13 DIAGNOSIS — M2560 Stiffness of unspecified joint, not elsewhere classified: Secondary | ICD-10-CM | POA: Insufficient documentation

## 2012-10-13 DIAGNOSIS — R293 Abnormal posture: Secondary | ICD-10-CM | POA: Insufficient documentation

## 2012-10-13 DIAGNOSIS — IMO0001 Reserved for inherently not codable concepts without codable children: Secondary | ICD-10-CM | POA: Insufficient documentation

## 2012-10-13 DIAGNOSIS — M2550 Pain in unspecified joint: Secondary | ICD-10-CM | POA: Insufficient documentation

## 2012-10-16 ENCOUNTER — Ambulatory Visit: Payer: Medicare Other | Attending: Family Medicine | Admitting: Physical Therapy

## 2012-10-19 ENCOUNTER — Ambulatory Visit: Payer: Medicare Other | Admitting: Physical Therapy

## 2012-10-21 ENCOUNTER — Encounter: Payer: Medicare Other | Admitting: Physical Therapy

## 2012-11-26 ENCOUNTER — Telehealth: Payer: Self-pay | Admitting: *Deleted

## 2012-11-26 NOTE — Telephone Encounter (Signed)
Manuel Cortez can you make this referral please since [facility] will not be back in [facility] for a while. Thank you!

## 2012-11-26 NOTE — Telephone Encounter (Signed)
Yes he is already going to the one in [place] so I think he just wants to change locations.

## 2012-11-26 NOTE — Telephone Encounter (Signed)
Pt called requesting a referral to [facility] in [facility]. Is it ok to refer pt?

## 2012-11-27 NOTE — Telephone Encounter (Signed)
Referral placed.

## 2012-12-01 ENCOUNTER — Other Ambulatory Visit: Payer: Self-pay | Admitting: *Deleted

## 2012-12-01 MED ORDER — CLONAZEPAM 0.25 MG PO TBDP: 0.2500 mg | ORAL_TABLET | Freq: Two times a day (BID) | ORAL | Status: DC | PRN

## 2012-12-02 ENCOUNTER — Encounter: Payer: Self-pay | Admitting: Physician Assistant

## 2012-12-02 ENCOUNTER — Ambulatory Visit (INDEPENDENT_AMBULATORY_CARE_PROVIDER_SITE_OTHER): Payer: Medicare Other | Admitting: Psychiatry

## 2012-12-02 ENCOUNTER — Ambulatory Visit (INDEPENDENT_AMBULATORY_CARE_PROVIDER_SITE_OTHER): Payer: Medicare Other | Admitting: Physician Assistant

## 2012-12-02 VITALS — BP 141/89 | HR 104 | Temp 98.1°F | Resp 18 | Wt 129.0 lb

## 2012-12-02 DIAGNOSIS — M4802 Spinal stenosis, cervical region: Secondary | ICD-10-CM

## 2012-12-02 DIAGNOSIS — F319 Bipolar disorder, unspecified: Secondary | ICD-10-CM

## 2012-12-02 DIAGNOSIS — G894 Chronic pain syndrome: Secondary | ICD-10-CM

## 2012-12-02 DIAGNOSIS — F419 Anxiety disorder, unspecified: Secondary | ICD-10-CM

## 2012-12-02 DIAGNOSIS — F411 Generalized anxiety disorder: Secondary | ICD-10-CM

## 2012-12-02 MED ORDER — CLONAZEPAM 0.25 MG PO TBDP: 0.2500 mg | ORAL_TABLET | Freq: Two times a day (BID) | ORAL | Status: DC | PRN

## 2012-12-02 MED ORDER — HYDROCODONE-ACETAMINOPHEN 7.5-325 MG PO TABS: 1.0000 | ORAL_TABLET | Freq: Three times a day (TID) | ORAL | Status: DC | PRN

## 2012-12-02 NOTE — Patient Instructions (Addendum)
Use pain medication ONLY as needed.

## 2012-12-02 NOTE — Progress Notes (Signed)
  Subjective:    Patient ID: Manuel Cortez., male    DOB: 23-Dec-1975, 38 y.o.   MRN: [id number]  HPI Patient comes in today because he has still yet to get in with pain clinic and he is in a lot of pain. He reports that his old pain clinic stated they would let him come back as long as he didn't smoke pot again. He has not smoked pot in the last 2 months. He tried the injections and PT as suggested by Dr. Karie Schwalbe and waited to make appt. When he went to make appt they stated that a doctor had to approve him coming back and he had until next Tuesday. They want to wait until they hear from that pain clinic before they ask for records to be sent to another pain clinic. He has nothing that will work for pain right not. He reports that Tramadol does nothing. Gabapentin causes him to go to sleep. His anxiety has gotten much worse because of pain and just not knowing where to turn. He has not needed a refill in a while but would like refill on anxiety medication.  Pt also went to psychologist at [facility]. Insurance will not pay for psychiatrist. Donnamarie Poag the pyschologist told him until he gets out of pain not able to tease out definite diagnosis of Bipolar.    Review of Systems  Constitutional: Negative.   HENT: Negative.   Respiratory: Negative.   Cardiovascular: Negative.   Gastrointestinal: Negative.   Musculoskeletal: Positive for back pain and arthralgias.  Skin: Negative.   Psychiatric/Behavioral: The patient is nervous/anxious.        Objective:   Physical Exam  Constitutional: He is oriented to person, place, and time.       Appears in pain and very stiff.   HENT:  Head: Normocephalic and atraumatic.  Eyes: Conjunctivae normal are normal.  Cardiovascular: Normal rate, regular rhythm and normal heart sounds.   Pulmonary/Chest: Effort normal and breath sounds normal.  Musculoskeletal:       Not able to have full ROM of neck. Appears very stiff and complains of pain with any  movement of neck with radiation down both arms.   Neurological: He is alert and oriented to person, place, and time.  Skin: Skin is warm and dry.  Psychiatric: He has a normal mood and affect. His behavior is normal.          Assessment & Plan:  Chronic pain syndrome- I did give some Norco to bridge gap for pain clinic answer and then getting appt. I made it clear that I was not going to continue this for many months. He needed to get in, stay clean. Call office on Tuesday if old pain clinic does not let him back in and we can [facility]. Pt encouraged to only use as needed.   Anxiety- Refilled rx for klonapin. I am still not convience that pt has bipolar issues. Will not press issue until we can get pain under control.    Spent 30 minutes with patient and greater than 50 percent of time spent counseling on pain control and treatment plan.

## 2012-12-03 DIAGNOSIS — G894 Chronic pain syndrome: Secondary | ICD-10-CM | POA: Insufficient documentation

## 2012-12-03 DIAGNOSIS — F419 Anxiety disorder, unspecified: Secondary | ICD-10-CM | POA: Insufficient documentation

## 2012-12-08 ENCOUNTER — Telehealth: Payer: Self-pay | Admitting: *Deleted

## 2012-12-08 NOTE — Telephone Encounter (Signed)
Patients wife called to update you that Nichola got into a pain clinic & his appt is on Feb 5th

## 2013-03-18 ENCOUNTER — Other Ambulatory Visit: Payer: Self-pay | Admitting: Physician Assistant

## 2013-03-23 ENCOUNTER — Other Ambulatory Visit: Payer: Self-pay | Admitting: Physician Assistant

## 2013-04-26 ENCOUNTER — Other Ambulatory Visit: Payer: Self-pay | Admitting: *Deleted

## 2013-04-26 MED ORDER — CLONAZEPAM 0.25 MG PO TBDP: ORAL_TABLET | ORAL | Status: DC

## 2013-05-22 ENCOUNTER — Other Ambulatory Visit: Payer: Self-pay | Admitting: Physician Assistant

## 2013-05-24 ENCOUNTER — Telehealth: Payer: Self-pay | Admitting: Physician Assistant

## 2013-05-24 NOTE — Telephone Encounter (Signed)
See if surgeon can write for these.

## 2013-05-24 NOTE — Telephone Encounter (Signed)
Pt notified to ask surgeon.

## 2013-05-24 NOTE — Telephone Encounter (Signed)
Patient is preparing to have surgery done on his spine/wife wants an  Order for hospital bed and foam pillow or anything that would make patient comfortable after surgery.

## 2013-05-25 ENCOUNTER — Other Ambulatory Visit: Payer: Self-pay | Admitting: Physician Assistant

## 2013-07-02 ENCOUNTER — Other Ambulatory Visit: Payer: Self-pay | Admitting: Physician Assistant

## 2013-07-02 NOTE — Telephone Encounter (Signed)
Needs appt

## 2013-08-11 ENCOUNTER — Other Ambulatory Visit: Payer: Self-pay | Admitting: Physician Assistant

## 2013-08-17 ENCOUNTER — Other Ambulatory Visit: Payer: Self-pay | Admitting: Physician Assistant

## 2013-08-18 NOTE — Telephone Encounter (Signed)
Pt needs appt to follow up. I will send albuterol inhaler.

## 2013-09-09 ENCOUNTER — Other Ambulatory Visit: Payer: Self-pay | Admitting: Physician Assistant

## 2013-09-09 ENCOUNTER — Other Ambulatory Visit: Payer: Self-pay | Admitting: *Deleted

## 2013-09-09 MED ORDER — CLONAZEPAM 0.25 MG PO TBDP: ORAL_TABLET | ORAL | Status: DC

## 2013-09-30 ENCOUNTER — Other Ambulatory Visit: Payer: Self-pay

## 2013-10-14 ENCOUNTER — Other Ambulatory Visit: Payer: Self-pay | Admitting: Physician Assistant

## 2013-11-16 ENCOUNTER — Telehealth: Payer: Self-pay | Admitting: *Deleted

## 2013-11-16 ENCOUNTER — Other Ambulatory Visit: Payer: Self-pay | Admitting: Physician Assistant

## 2013-11-16 NOTE — Telephone Encounter (Signed)
Pt informed we can only refill Clonazepam for 2 weeks worth due to past due for an appt.  Meyer Cory, LPN

## 2013-12-08 ENCOUNTER — Ambulatory Visit: Payer: Self-pay | Admitting: Physician Assistant

## 2013-12-15 ENCOUNTER — Ambulatory Visit: Payer: Self-pay | Admitting: Physician Assistant

## 2014-01-07 ENCOUNTER — Encounter: Payer: Self-pay | Admitting: Physician Assistant

## 2014-01-07 ENCOUNTER — Ambulatory Visit (INDEPENDENT_AMBULATORY_CARE_PROVIDER_SITE_OTHER): Payer: Medicaid Other | Admitting: Physician Assistant

## 2014-01-07 VITALS — BP 145/84 | HR 89 | Wt 121.0 lb

## 2014-01-07 DIAGNOSIS — Z79899 Other long term (current) drug therapy: Secondary | ICD-10-CM

## 2014-01-07 DIAGNOSIS — F411 Generalized anxiety disorder: Secondary | ICD-10-CM

## 2014-01-07 DIAGNOSIS — F419 Anxiety disorder, unspecified: Secondary | ICD-10-CM

## 2014-01-07 MED ORDER — ALPRAZOLAM 0.25 MG PO TABS: 0.2500 mg | ORAL_TABLET | Freq: Two times a day (BID) | ORAL | Status: DC | PRN

## 2014-01-07 MED ORDER — HYDROCODONE-ACETAMINOPHEN 10-325 MG PO TABS: ORAL_TABLET | ORAL | Status: DC

## 2014-01-07 MED ORDER — ALBUTEROL SULFATE HFA 108 (90 BASE) MCG/ACT IN AERS: INHALATION_SPRAY | RESPIRATORY_TRACT | Status: DC

## 2014-01-07 MED ORDER — DICLOFENAC SODIUM 1 % TD GEL: TRANSDERMAL | Status: DC

## 2014-01-07 MED ORDER — ALBUTEROL SULFATE (2.5 MG/3ML) 0.083% IN NEBU: 2.5000 mg | INHALATION_SOLUTION | Freq: Four times a day (QID) | RESPIRATORY_TRACT | Status: DC | PRN

## 2014-01-09 NOTE — Progress Notes (Signed)
   Subjective:    Patient ID: Claretha CooperEarl Goecke Jr., male    DOB: 03/31/1976, 38 y.o.   MRN: [id number]  HPI Pt is a 38 yo male who presents to the clinic to follow up on anxiety. He has been out of his xanax for a month or so now but would like to keep on hand for when anxiety gets worse. Tried other daily SSRI/SSNR but seems to not be able to tolerate. Uses xanax 1-2 times a day as needed. Anxiety is better with pain control. Pain clinic is doing very well at controling pain.    Review of Systems     Objective:   Physical Exam  Constitutional: He is oriented to person, place, and time. He appears well-developed and well-nourished.  HENT:  Head: Normocephalic and atraumatic.  Cardiovascular: Normal rate, regular rhythm and normal heart sounds.   Pulmonary/Chest: Effort normal and breath sounds normal.  Neurological: He is alert and oriented to person, place, and time.  Psychiatric: He has a normal mood and affect. His behavior is normal.          Assessment & Plan:  Anxiety- refilled xanax to use as needed for anxiety. Brief discussion about other daily anti-anxiety. Reminded me that he has tried many and seemed to have reactions or intolerances to the all. Would like to stay on xanax as needed.   Chronic pain syndrome/Cervical spinal stenosis- managed by pain clinic and orthopedists. Needs surgery but very scared.

## 2014-02-05 ENCOUNTER — Other Ambulatory Visit: Payer: Self-pay | Admitting: Physician Assistant

## 2014-04-27 LAB — COMPLETE METABOLIC PANEL WITH GFR
ALBUMIN: 4.3 g/dL (ref 3.5–5.2)
ALK PHOS: 79 U/L (ref 39–117)
ALT: 12 U/L (ref 0–53)
AST: 22 U/L (ref 0–37)
BUN: 12 mg/dL (ref 6–23)
CHLORIDE: 103 meq/L (ref 96–112)
CO2: 26 mEq/L (ref 19–32)
Calcium: 9.2 mg/dL (ref 8.4–10.5)
Creat: 0.79 mg/dL (ref 0.50–1.35)
GFR, Est African American: >89 mL/min
GFR, Est Non African American: >89 mL/min
Glucose, Bld: 85 mg/dL (ref 70–99)
POTASSIUM: 3.8 meq/L (ref 3.5–5.3)
SODIUM: 139 meq/L (ref 135–145)
TOTAL PROTEIN: 6.8 g/dL (ref 6.0–8.3)
Total Bilirubin: 0.4 mg/dL (ref 0.2–1.2)

## 2014-05-06 ENCOUNTER — Other Ambulatory Visit: Payer: Self-pay | Admitting: Physician Assistant

## 2014-07-04 ENCOUNTER — Telehealth: Payer: Self-pay | Admitting: *Deleted

## 2014-07-04 NOTE — Telephone Encounter (Signed)
Pt's wife left vm asking for a referral to a different pain clinic.

## 2014-07-05 ENCOUNTER — Other Ambulatory Visit: Payer: Self-pay | Admitting: Physician Assistant

## 2014-07-05 DIAGNOSIS — M4802 Spinal stenosis, cervical region: Secondary | ICD-10-CM

## 2014-07-05 NOTE — Telephone Encounter (Signed)
Ok I sent referral;however, this is a timely process. When you are dismissed from pain clinic it is a very big deal in the pain clinic world. It is very important if we find someone to manage your pain then you do whatever is requested.

## 2014-07-06 NOTE — Telephone Encounter (Signed)
Pt's wife notified. She wants to know if you would be willing to rx him his pain medication while he is waiting to hear from the pain clinic referral.

## 2014-07-12 NOTE — Telephone Encounter (Signed)
i am willing to give for 2 months max.

## 2014-07-12 NOTE — Telephone Encounter (Signed)
Trying calling and was unable to leave vm.

## 2014-09-12 ENCOUNTER — Other Ambulatory Visit: Payer: Self-pay | Admitting: *Deleted

## 2014-09-12 MED ORDER — ALPRAZOLAM 0.25 MG PO TABS: 0.2500 mg | ORAL_TABLET | Freq: Two times a day (BID) | ORAL | Status: DC | PRN

## 2014-09-20 ENCOUNTER — Telehealth: Payer: Self-pay | Admitting: *Deleted

## 2014-09-20 NOTE — Telephone Encounter (Signed)
Pt called stating that he is finally ready to have the neck surgery.  I also sent him to scheduling to make an appt with you to f/u with anxiety.

## 2014-09-23 ENCOUNTER — Ambulatory Visit (INDEPENDENT_AMBULATORY_CARE_PROVIDER_SITE_OTHER): Payer: Medicare Other | Admitting: Physician Assistant

## 2014-09-23 ENCOUNTER — Encounter: Payer: Self-pay | Admitting: Physician Assistant

## 2014-09-23 VITALS — BP 140/87 | HR 83 | Wt 124.0 lb

## 2014-09-23 DIAGNOSIS — M4802 Spinal stenosis, cervical region: Secondary | ICD-10-CM

## 2014-09-23 DIAGNOSIS — F419 Anxiety disorder, unspecified: Secondary | ICD-10-CM

## 2014-09-23 MED ORDER — ALPRAZOLAM 0.5 MG PO TABS: 0.5000 mg | ORAL_TABLET | Freq: Two times a day (BID) | ORAL | Status: DC | PRN

## 2014-09-23 NOTE — Progress Notes (Signed)
   Subjective:    Patient ID: Manuel CooperEarl Greenhalgh Jr., male    DOB: 04/16/1976, 38 y.o.   MRN: [id number]  HPI Pt presents to the clinic to get medication refill for xanax. He doesn't feel like .25 are helping that much. He uses for his anxiety as well as they help with chronic pain in neck by helping him to relax. Needs refill. Seeing pain management for cervical stenosis. Continues to be in pain would like referral to surgeon.    Review of Systems  All other systems reviewed and are negative.      Objective:   Physical Exam  Constitutional: He is oriented to person, place, and time. He appears well-developed and well-nourished.  HENT:  Head: Normocephalic and atraumatic.  Cardiovascular: Normal rate, regular rhythm and normal heart sounds.   Pulmonary/Chest: Effort normal and breath sounds normal.  Neurological: He is alert and oriented to person, place, and time.  Skin: Skin is dry.  Psychiatric: He has a normal mood and affect. His behavior is normal.          Assessment & Plan:    Anxiety- increased xanax to .5mg  up to twice a day. Refilled for 6 months.   Chronic pain/ cervical spinal stenosis, without cervical myelopathy- pt is ready to consult surgery. Will refer. Continue pain management with pain clinic.

## 2015-01-06 ENCOUNTER — Other Ambulatory Visit: Payer: Self-pay | Admitting: Physician Assistant

## 2015-03-29 ENCOUNTER — Other Ambulatory Visit: Payer: Self-pay | Admitting: Physician Assistant

## 2015-03-30 ENCOUNTER — Telehealth: Payer: Self-pay | Admitting: *Deleted

## 2015-03-30 NOTE — Telephone Encounter (Signed)
Called and LVM informing pt that he will need to make a f/u appt since he has not been seen since 09/23/2014 and at that time Lesly RubensteinJade had refilled his meds for 6 mos.Loralee PacasBarkley, Cynthya Yam [place]

## 2015-08-15 ENCOUNTER — Encounter: Payer: Self-pay | Admitting: Physician Assistant

## 2015-08-15 ENCOUNTER — Ambulatory Visit (INDEPENDENT_AMBULATORY_CARE_PROVIDER_SITE_OTHER): Payer: Medicare Other | Admitting: Physician Assistant

## 2015-08-15 VITALS — BP 129/76 | HR 57 | Ht 69.5 in | Wt 123.0 lb

## 2015-08-15 DIAGNOSIS — M25562 Pain in left knee: Secondary | ICD-10-CM

## 2015-08-15 DIAGNOSIS — F319 Bipolar disorder, unspecified: Secondary | ICD-10-CM

## 2015-08-15 DIAGNOSIS — J439 Emphysema, unspecified: Secondary | ICD-10-CM | POA: Diagnosis not present

## 2015-08-15 DIAGNOSIS — M25561 Pain in right knee: Secondary | ICD-10-CM | POA: Diagnosis not present

## 2015-08-15 DIAGNOSIS — M545 Low back pain, unspecified: Secondary | ICD-10-CM | POA: Insufficient documentation

## 2015-08-15 DIAGNOSIS — F419 Anxiety disorder, unspecified: Secondary | ICD-10-CM | POA: Diagnosis not present

## 2015-08-15 MED ORDER — ALBUTEROL SULFATE HFA 108 (90 BASE) MCG/ACT IN AERS
INHALATION_SPRAY | RESPIRATORY_TRACT | Status: DC
Start: 2015-08-15 — End: 2016-01-31

## 2015-08-15 MED ORDER — ALBUTEROL SULFATE (2.5 MG/3ML) 0.083% IN NEBU: 2.5000 mg | INHALATION_SOLUTION | Freq: Four times a day (QID) | RESPIRATORY_TRACT | Status: DC | PRN

## 2015-08-15 MED ORDER — ALPRAZOLAM 0.5 MG PO TABS: ORAL_TABLET | ORAL | Status: DC

## 2015-08-15 MED ORDER — OMEPRAZOLE 20 MG PO CPDR: 20.0000 mg | DELAYED_RELEASE_CAPSULE | Freq: Two times a day (BID) | ORAL | Status: DC

## 2015-08-15 MED ORDER — UMECLIDINIUM BROMIDE 62.5 MCG/INH IN AEPB: 1.0000 | INHALATION_SPRAY | Freq: Every day | RESPIRATORY_TRACT | Status: DC

## 2015-08-15 NOTE — Progress Notes (Signed)
   Subjective:    Patient ID: Manuel Cortez., male    DOB: Oct 20, 1976, 39 y.o.   MRN: [id number]  HPI Patient is a 39 year old male who presents to the clinic for medication refill today. He has multiple ongoing chronic conditions.  Patient is diagnosed with cervical degenerative disc disease with chronic pain syndrome. He has multiple sites of pain in his lower back. He currently sees pain management for this. He'll tried epidural injections but they made him have migraines for proximal 6 months. He would like for me to fill out a handicap paperwork for him to get sticker today.  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 He reports he has tried maintenance medication in the past and is felt like his lungs were on fire. He has been using albuterol multiple times a week but not every day.  Bipolar/anxiety-patient has tried numerous medications for bipolar over the years. He seems to get a side effect or does not like the way they make him feel. He has been evaluated by psychiatry and he is not compliant with her regimen. He does feel like the Xanax gives him some relief. He does not take every day. He went for about couple months without taking any. He only use them when he needs them.  Review of Systems  All other systems reviewed and are negative.      Objective:   Physical Exam  Constitutional: He is oriented to person, place, and time. He appears well-developed and well-nourished.  HENT:  Head: Normocephalic and atraumatic.  Cardiovascular: Normal rate, regular rhythm and normal heart sounds.   Pulmonary/Chest: Effort normal and breath sounds normal.  Neurological: He is alert and oriented to person, place, and time.  Psychiatric: He has a normal mood and affect. His behavior is normal.          Assessment & Plan:  CDD/chronic pain syndrome/low back pain of multiple sites-patient is managed by pain management. I did fill a handicap sticker  today.  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 I would like for patient to start a maintenance medication. Increase was given for ease of use and once a day compliance. Follow-up in 2 months. Discussed proper use for albuterol for rescue only.  Bipolar/anxiety-Santos refilled for as needed up to twice a day usage. Discuss patient only to use when needed. We did have another discussion about daily medication for maintenance of mood. I do not suspect patient be compliance therefore did not push any medications today.  Last fasting labs were 2013 for patient. Patient made aware he needs to schedule a complete physical in the next couple months.

## 2016-01-31 ENCOUNTER — Other Ambulatory Visit: Payer: Self-pay | Admitting: Physician Assistant

## 2016-02-16 ENCOUNTER — Encounter: Payer: Self-pay | Admitting: Physician Assistant

## 2016-02-16 ENCOUNTER — Ambulatory Visit (INDEPENDENT_AMBULATORY_CARE_PROVIDER_SITE_OTHER): Payer: Medicare Other | Admitting: Physician Assistant

## 2016-02-16 VITALS — BP 124/83 | HR 80 | Ht 69.5 in | Wt 119.0 lb

## 2016-02-16 DIAGNOSIS — J439 Emphysema, unspecified: Secondary | ICD-10-CM

## 2016-02-16 DIAGNOSIS — F319 Bipolar disorder, unspecified: Secondary | ICD-10-CM

## 2016-02-16 MED ORDER — ALBUTEROL SULFATE HFA 108 (90 BASE) MCG/ACT IN AERS: INHALATION_SPRAY | RESPIRATORY_TRACT | Status: DC

## 2016-02-16 NOTE — Progress Notes (Signed)
   Subjective:    Patient ID: Manuel CooperEarl Eroh Jr., male    DOB: 01/28/1976, 40 y.o.   MRN: [id number]  HPI Pt is a 40 yo male who presents to the clinic wanting note for social security office stating that he can be responsible for his monthly check. Previously he was having major depression symptoms and wife was placed as co-signer for money given to him. He would like that removed. He is only taking xanax as needed. He has diagnosis of bipolar. He is not on mood stablizer but states his mood has been stable.He see's pain clinic for chronic pain.   COPD using albuterol inhalers as needed. They seem to help but when he tries daily inhalers seems to make his lungs "worse". Did not like incruse.    Review of Systems  All other systems reviewed and are negative.      Objective:   Physical Exam  Constitutional: He is oriented to person, place, and time. He appears well-developed and well-nourished.  HENT:  Head: Normocephalic and atraumatic.  Cardiovascular: Normal rate, regular rhythm and normal heart sounds.   Pulmonary/Chest: Effort normal and breath sounds normal.  Neurological: He is alert and oriented to person, place, and time.  Psychiatric: He has a normal mood and affect. His behavior is normal.          Assessment & Plan:  Bipolar- Written note that he is mentally stable for own management of money. I do not have any proof he is unstable. He has kept appts with our clinic and pain management and seems to be doing well.   COPD- refilled albuterol. Discussed trying other daily inhalers. Pt declined at this time.   Needs CPE.

## 2016-03-01 ENCOUNTER — Ambulatory Visit (INDEPENDENT_AMBULATORY_CARE_PROVIDER_SITE_OTHER): Payer: Medicare Other | Admitting: Physician Assistant

## 2016-03-01 ENCOUNTER — Encounter: Payer: Self-pay | Admitting: Physician Assistant

## 2016-03-01 VITALS — BP 160/96 | HR 85 | Wt 119.0 lb

## 2016-03-01 DIAGNOSIS — F1123 Opioid dependence with withdrawal: Secondary | ICD-10-CM | POA: Diagnosis not present

## 2016-03-01 DIAGNOSIS — G894 Chronic pain syndrome: Secondary | ICD-10-CM

## 2016-03-01 DIAGNOSIS — M545 Low back pain, unspecified: Secondary | ICD-10-CM

## 2016-03-01 MED ORDER — ALPRAZOLAM 0.5 MG PO TABS: ORAL_TABLET | ORAL | Status: DC

## 2016-03-01 NOTE — Progress Notes (Signed)
   Subjective:    Patient ID: Manuel CooperEarl Redmon Jr., male    DOB: 08/07/1976, 40 y.o.   MRN: [id number]  HPI  Pt is a 40 yo male who presents to the clinic discussing pain control. He is seen by [facility] [place]. The patient was decreased on his pain medications due to UDS being negative for hydrocodone metabolites per note received by pain clinic. Pt is in a lot of pain today. He is shaking and wanting pain control. Per pt cannot tolerate lyrica, gabapentin, NSAIDs. He has follow up appt at pain clinic on 13th of April. He wants "his pain medications back where he was controlled".  Review of Systems    see HPI. Objective:   Physical Exam  Constitutional: He appears well-developed and well-nourished.  Cardiovascular: Normal rate, regular rhythm and normal heart sounds.   Musculoskeletal:  All movement creates pain per pt. He is sitted in a chair during entire exam not wanting to move.   Psychiatric:  Shaky, in tears, emotional.           Assessment & Plan:  Chronic pain syndrome/chronic opiate usage- discussed we would not give any narcotics for pain to void his pain contract. He needs to keep follow up appt with CPS on 03/07/16. Offered toradol injection which was declined due to fear of needles. Given samples of vimovo to try to see if tolerates since NSAIds cause GI upset and vimovo would protect gut. He can let us know if helps. Per pt lyrica and gabpentin have never been tolerated. Discussed with patient some of his symptoms seem consistent with opiate withdrawal.

## 2016-03-04 ENCOUNTER — Other Ambulatory Visit: Payer: Self-pay

## 2016-03-04 ENCOUNTER — Telehealth: Payer: Self-pay

## 2016-03-04 DIAGNOSIS — F1123 Opioid dependence with withdrawal: Secondary | ICD-10-CM | POA: Insufficient documentation

## 2016-03-04 MED ORDER — NAPROXEN-ESOMEPRAZOLE 500-20 MG PO TBEC: 1.0000 | DELAYED_RELEASE_TABLET | Freq: Two times a day (BID) | ORAL | Status: DC

## 2016-03-04 MED ORDER — DICLOFENAC SODIUM 1 % TD GEL: 2.0000 g | Freq: Four times a day (QID) | TRANSDERMAL | Status: DC

## 2016-03-04 NOTE — Telephone Encounter (Signed)
Can send vimovo to [facility] so that he can get in mail. He can come in for toradol injection nurse visit 60mg  IM. We will not prescribe narcotics from this office.

## 2016-03-04 NOTE — Progress Notes (Signed)
Ok 1 application up to 4 times a day #1 tube refills #5.

## 2016-03-04 NOTE — Telephone Encounter (Signed)
 500mg /20mg  1 tablet twice a day #60 RF5

## 2016-03-05 ENCOUNTER — Other Ambulatory Visit: Payer: Self-pay | Admitting: *Deleted

## 2016-03-05 MED ORDER — NAPROXEN-ESOMEPRAZOLE 500-20 MG PO TBEC: 1.0000 | DELAYED_RELEASE_TABLET | Freq: Two times a day (BID) | ORAL | Status: DC

## 2016-03-06 ENCOUNTER — Telehealth: Payer: Self-pay

## 2016-03-06 NOTE — Telephone Encounter (Signed)
We have samples of this medication vimovo that we could give. I am NOT prescribing any narcotics to this patient.

## 2016-03-06 NOTE — Telephone Encounter (Signed)
Manuel Cortez states the medication needs a PA. He is in a lot of pain and wants a different medication. The pain clinic stated his PCP could prescribe something until the 6517 th of April.

## 2016-03-07 NOTE — Telephone Encounter (Signed)
Tried call patient back, unable to leave a message due to the mail box hasn't been set up.

## 2016-06-12 ENCOUNTER — Other Ambulatory Visit: Payer: Self-pay | Admitting: Physician Assistant

## 2016-09-30 ENCOUNTER — Other Ambulatory Visit: Payer: Self-pay | Admitting: Physician Assistant

## 2016-11-21 ENCOUNTER — Other Ambulatory Visit: Payer: Self-pay | Admitting: Physician Assistant

## 2016-12-19 ENCOUNTER — Other Ambulatory Visit: Payer: Self-pay | Admitting: Physician Assistant

## 2017-02-07 ENCOUNTER — Other Ambulatory Visit: Payer: Self-pay | Admitting: Physician Assistant

## 2017-05-09 ENCOUNTER — Other Ambulatory Visit: Payer: Self-pay | Admitting: Sports Medicine

## 2017-05-14 ENCOUNTER — Encounter: Payer: Self-pay | Admitting: Physician Assistant

## 2017-05-14 ENCOUNTER — Ambulatory Visit (INDEPENDENT_AMBULATORY_CARE_PROVIDER_SITE_OTHER): Payer: Medicare Other | Admitting: Physician Assistant

## 2017-05-14 VITALS — BP 143/78 | HR 74 | Ht 70.0 in | Wt 123.0 lb

## 2017-05-14 DIAGNOSIS — G894 Chronic pain syndrome: Secondary | ICD-10-CM

## 2017-05-14 DIAGNOSIS — F419 Anxiety disorder, unspecified: Secondary | ICD-10-CM

## 2017-05-14 DIAGNOSIS — F319 Bipolar disorder, unspecified: Secondary | ICD-10-CM

## 2017-05-14 MED ORDER — CLONAZEPAM 0.5 MG PO TABS: 0.5000 mg | ORAL_TABLET | Freq: Two times a day (BID) | ORAL | 5 refills | Status: DC | PRN
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
## 2017-05-16 ENCOUNTER — Encounter: Payer: Self-pay | Admitting: Physician Assistant

## 2017-10-31 ENCOUNTER — Other Ambulatory Visit: Payer: Self-pay | Admitting: *Deleted

## 2017-10-31 MED ORDER — OMEPRAZOLE 20 MG PO CPDR: 20.0000 mg | DELAYED_RELEASE_CAPSULE | Freq: Two times a day (BID) | ORAL | 6 refills | Status: DC

## 2017-11-14 ENCOUNTER — Other Ambulatory Visit: Payer: Self-pay | Admitting: Physician Assistant

## 2017-11-14 ENCOUNTER — Ambulatory Visit: Payer: Self-pay | Admitting: Physician Assistant

## 2017-11-14 DIAGNOSIS — F419 Anxiety disorder, unspecified: Secondary | ICD-10-CM

## 2017-11-14 MED ORDER — CLONAZEPAM 0.5 MG PO TABS: 0.5000 mg | ORAL_TABLET | Freq: Two times a day (BID) | ORAL | 0 refills | Status: DC | PRN

## 2017-11-21 ENCOUNTER — Ambulatory Visit (INDEPENDENT_AMBULATORY_CARE_PROVIDER_SITE_OTHER): Payer: Medicare Other | Admitting: Physician Assistant

## 2017-11-21 ENCOUNTER — Encounter: Payer: Self-pay | Admitting: Physician Assistant

## 2017-11-21 VITALS — BP 116/67 | HR 65 | Temp 98.7°F | Resp 16 | Ht 69.0 in | Wt 125.0 lb

## 2017-11-21 DIAGNOSIS — F419 Anxiety disorder, unspecified: Secondary | ICD-10-CM | POA: Diagnosis not present

## 2017-11-21 DIAGNOSIS — K2100 Gastro-esophageal reflux disease with esophagitis, without bleeding: Secondary | ICD-10-CM

## 2017-11-21 DIAGNOSIS — Z720 Tobacco use: Secondary | ICD-10-CM | POA: Diagnosis not present

## 2017-11-21 DIAGNOSIS — Z13220 Encounter for screening for lipoid disorders: Secondary | ICD-10-CM | POA: Diagnosis not present

## 2017-11-21 DIAGNOSIS — G894 Chronic pain syndrome: Secondary | ICD-10-CM | POA: Diagnosis not present

## 2017-11-21 DIAGNOSIS — Z131 Encounter for screening for diabetes mellitus: Secondary | ICD-10-CM | POA: Diagnosis not present

## 2017-11-21 DIAGNOSIS — J439 Emphysema, unspecified: Secondary | ICD-10-CM

## 2017-11-21 MED ORDER — UMECLIDINIUM-VILANTEROL 62.5-25 MCG/INH IN AEPB: 1.0000 | INHALATION_SPRAY | Freq: Every day | RESPIRATORY_TRACT | 2 refills | Status: DC

## 2017-11-21 MED ORDER — CLONAZEPAM 0.5 MG PO TABS: 0.5000 mg | ORAL_TABLET | Freq: Two times a day (BID) | ORAL | 1 refills | Status: DC | PRN
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
## 2017-11-22 ENCOUNTER — Encounter: Payer: Self-pay | Admitting: Physician Assistant

## 2018-02-17 ENCOUNTER — Other Ambulatory Visit: Payer: Self-pay | Admitting: *Deleted

## 2018-02-17 MED ORDER — ALBUTEROL SULFATE HFA 108 (90 BASE) MCG/ACT IN AERS: INHALATION_SPRAY | RESPIRATORY_TRACT | 3 refills | Status: DC

## 2018-02-20 ENCOUNTER — Ambulatory Visit (INDEPENDENT_AMBULATORY_CARE_PROVIDER_SITE_OTHER): Payer: Medicare Other | Admitting: Physician Assistant

## 2018-02-20 ENCOUNTER — Encounter: Payer: Self-pay | Admitting: Physician Assistant

## 2018-02-20 VITALS — BP 129/74 | HR 83 | Wt 123.0 lb

## 2018-02-20 DIAGNOSIS — Z131 Encounter for screening for diabetes mellitus: Secondary | ICD-10-CM | POA: Diagnosis not present

## 2018-02-20 DIAGNOSIS — Z0000 Encounter for general adult medical examination without abnormal findings: Secondary | ICD-10-CM

## 2018-02-20 DIAGNOSIS — J439 Emphysema, unspecified: Secondary | ICD-10-CM

## 2018-02-20 DIAGNOSIS — F419 Anxiety disorder, unspecified: Secondary | ICD-10-CM

## 2018-02-20 DIAGNOSIS — Z13220 Encounter for screening for lipoid disorders: Secondary | ICD-10-CM | POA: Diagnosis not present

## 2018-02-20 MED ORDER — BUDESONIDE-FORMOTEROL FUMARATE 80-4.5 MCG/ACT IN AERO: 2.0000 | INHALATION_SPRAY | Freq: Two times a day (BID) | RESPIRATORY_TRACT | 2 refills | Status: DC

## 2018-02-20 MED ORDER — ALBUTEROL SULFATE HFA 108 (90 BASE) MCG/ACT IN AERS: INHALATION_SPRAY | RESPIRATORY_TRACT | 3 refills | Status: DC

## 2018-02-20 MED ORDER — ALBUTEROL SULFATE (2.5 MG/3ML) 0.083% IN NEBU: INHALATION_SOLUTION | RESPIRATORY_TRACT | 1 refills | Status: AC

## 2018-02-20 MED ORDER — CLONAZEPAM 0.5 MG PO TABS: 0.5000 mg | ORAL_TABLET | Freq: Two times a day (BID) | ORAL | 1 refills | Status: DC | PRN
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
## 2018-02-20 NOTE — Patient Instructions (Signed)
Start symbicort follow up in 6 weeks.

## 2018-02-23 ENCOUNTER — Encounter: Payer: Self-pay | Admitting: Physician Assistant

## 2018-04-03 ENCOUNTER — Ambulatory Visit (INDEPENDENT_AMBULATORY_CARE_PROVIDER_SITE_OTHER): Payer: Medicare Other | Admitting: Physician Assistant

## 2018-04-03 ENCOUNTER — Encounter: Payer: Self-pay | Admitting: Physician Assistant

## 2018-04-03 VITALS — BP 126/62 | HR 65 | Ht 69.0 in | Wt 128.0 lb

## 2018-04-03 DIAGNOSIS — J439 Emphysema, unspecified: Secondary | ICD-10-CM

## 2018-04-03 DIAGNOSIS — M21612 Bunion of left foot: Secondary | ICD-10-CM

## 2018-04-03 DIAGNOSIS — M79672 Pain in left foot: Secondary | ICD-10-CM | POA: Diagnosis not present

## 2018-04-03 LAB — COMPLETE METABOLIC PANEL WITH GFR
AG Ratio: 2 (calc) (ref 1.0–2.5)
ALT: 10 U/L (ref 9–46)
AST: 21 U/L (ref 10–40)
Albumin: 4.3 g/dL (ref 3.6–5.1)
Alkaline phosphatase (APISO): 72 U/L (ref 40–115)
BILIRUBIN TOTAL: 0.4 mg/dL (ref 0.2–1.2)
BUN: 13 mg/dL (ref 7–25)
CO2: 29 mmol/L (ref 20–32)
Calcium: 9 mg/dL (ref 8.6–10.3)
Chloride: 104 mmol/L (ref 98–110)
Creat: 0.85 mg/dL (ref 0.60–1.35)
GFR, EST NON AFRICAN AMERICAN: 107 mL/min/{1.73_m2} (ref >=60)
GFR, Est African American: 124 mL/min/{1.73_m2} (ref >=60)
GLOBULIN: 2.2 g/dL (ref 1.9–3.7)
Glucose, Bld: 118 mg/dL — ABNORMAL HIGH (ref 65–99)
Potassium: 4 mmol/L (ref 3.5–5.3)
SODIUM: 142 mmol/L (ref 135–146)
Total Protein: 6.5 g/dL (ref 6.1–8.1)

## 2018-04-03 LAB — LIPID PANEL W/REFLEX DIRECT LDL
CHOL/HDL RATIO: 2.2 (calc) (ref <5.0)
CHOLESTEROL: 145 mg/dL (ref <200)
HDL: 65 mg/dL (ref >40)
LDL CHOLESTEROL (CALC): 65 mg/dL
NON-HDL CHOLESTEROL (CALC): 80 mg/dL (ref <130)
TRIGLYCERIDES: 66 mg/dL (ref <150)

## 2018-04-03 LAB — CBC WITH DIFFERENTIAL/PLATELET
BASOS ABS: 38 {cells}/uL (ref 0–200)
Basophils Relative: 0.7 %
EOS ABS: 130 {cells}/uL (ref 15–500)
Eosinophils Relative: 2.4 %
HCT: 41 % (ref 38.5–50.0)
Hemoglobin: 13.9 g/dL (ref 13.2–17.1)
Lymphs Abs: 2300 cells/uL (ref 850–3900)
MCH: 31.2 pg (ref 27.0–33.0)
MCHC: 33.9 g/dL (ref 32.0–36.0)
MCV: 91.9 fL (ref 80.0–100.0)
MONOS PCT: 11.2 %
MPV: 10.7 fL (ref 7.5–12.5)
NEUTROS PCT: 43.1 %
Neutro Abs: 2327 cells/uL (ref 1500–7800)
PLATELETS: 186 10*3/uL (ref 140–400)
RBC: 4.46 10*6/uL (ref 4.20–5.80)
RDW: 12.4 % (ref 11.0–15.0)
TOTAL LYMPHOCYTE: 42.6 %
WBC: 5.4 10*3/uL (ref 3.8–10.8)
WBCMIX: 605 {cells}/uL (ref 200–950)

## 2018-04-03 MED ORDER — TIOTROPIUM BROMIDE MONOHYDRATE 2.5 MCG/ACT IN AERS: 2.0000 | INHALATION_SPRAY | Freq: Every day | RESPIRATORY_TRACT | 5 refills | Status: DC
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
## 2018-04-03 NOTE — Patient Instructions (Signed)

## 2018-04-05 ENCOUNTER — Encounter: Payer: Self-pay | Admitting: Physician Assistant

## 2018-04-05 DIAGNOSIS — M79672 Pain in left foot: Secondary | ICD-10-CM | POA: Insufficient documentation

## 2018-04-05 DIAGNOSIS — M21612 Bunion of left foot: Secondary | ICD-10-CM | POA: Insufficient documentation

## 2018-04-05 NOTE — Progress Notes (Signed)
Call pt: cholesterol looks great! Kidney, liver, glucose looks great.

## 2018-04-06 ENCOUNTER — Telehealth: Payer: Self-pay | Admitting: Physician Assistant

## 2018-04-06 MED ORDER — FLUTICASONE-UMECLIDIN-VILANT 100-62.5-25 MCG/INH IN AEPB: 1.0000 | INHALATION_SPRAY | Freq: Every day | RESPIRATORY_TRACT | 2 refills | Status: DC

## 2018-04-06 NOTE — Telephone Encounter (Signed)
Will you call patient and see if he has tried on willing to try Trelegy?

## 2018-04-06 NOTE — Telephone Encounter (Signed)
The preferred product for inhalers on this patients insurance are Trelegy Ellipta, Ventolin HFA, or Wixela Inhub inhaler. Please advise.

## 2018-04-06 NOTE — Telephone Encounter (Signed)
Done

## 2018-04-06 NOTE — Telephone Encounter (Signed)
Called patient and he stated that he has not tried Trelegy and he is willing to try Trelegy. Please send to [facility] [employer]. Thanks.

## 2018-05-11 ENCOUNTER — Ambulatory Visit: Payer: Self-pay | Admitting: Podiatry

## 2018-05-18 ENCOUNTER — Ambulatory Visit: Payer: Medicare Other | Admitting: Podiatry

## 2018-05-25 ENCOUNTER — Ambulatory Visit (INDEPENDENT_AMBULATORY_CARE_PROVIDER_SITE_OTHER): Payer: Medicare Other | Admitting: Podiatry

## 2018-05-25 ENCOUNTER — Encounter: Payer: Self-pay | Admitting: Podiatry

## 2018-05-25 DIAGNOSIS — M1990 Unspecified osteoarthritis, unspecified site: Secondary | ICD-10-CM

## 2018-05-25 DIAGNOSIS — M21962 Unspecified acquired deformity of left lower leg: Secondary | ICD-10-CM | POA: Diagnosis not present

## 2018-05-25 DIAGNOSIS — M2022 Hallux rigidus, left foot: Secondary | ICD-10-CM

## 2018-05-25 NOTE — Patient Instructions (Signed)
Seen for painful joint. Noted of limited joint space and motion. As per request, cortisone injection given and surgery consent form reviewed for Biplane Austin bunionectomy. Will schedule surgery in next 2-3 weeks.
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
## 2018-05-27 ENCOUNTER — Encounter: Payer: Self-pay | Admitting: Podiatry

## 2018-06-04 ENCOUNTER — Telehealth: Payer: Self-pay

## 2018-06-04 ENCOUNTER — Other Ambulatory Visit: Payer: Self-pay | Admitting: Podiatry

## 2018-06-04 MED ORDER — OXYCODONE-ACETAMINOPHEN 10-325 MG PO TABS: 1.0000 | ORAL_TABLET | Freq: Four times a day (QID) | ORAL | 0 refills | Status: DC | PRN

## 2018-06-04 NOTE — Telephone Encounter (Signed)
[facility] were [facility] to [facility] to provide pre op instructions and arrival time. Attempted to reach pt. No answer and no VM.

## 2018-06-05 ENCOUNTER — Other Ambulatory Visit: Payer: Self-pay

## 2018-06-10 ENCOUNTER — Encounter: Payer: Medicare Other | Admitting: Podiatry

## 2018-08-17 ENCOUNTER — Encounter: Payer: Self-pay | Admitting: Podiatry

## 2018-08-17 ENCOUNTER — Ambulatory Visit (INDEPENDENT_AMBULATORY_CARE_PROVIDER_SITE_OTHER): Payer: Medicare Other | Admitting: Podiatry

## 2018-08-17 DIAGNOSIS — M1990 Unspecified osteoarthritis, unspecified site: Secondary | ICD-10-CM | POA: Diagnosis not present

## 2018-08-17 DIAGNOSIS — M2022 Hallux rigidus, left foot: Secondary | ICD-10-CM

## 2018-08-17 NOTE — Patient Instructions (Signed)
As per request, left great toe injected with Cortisone. Return as needed.
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
## 2018-09-09 ENCOUNTER — Other Ambulatory Visit: Payer: Self-pay | Admitting: Physician Assistant

## 2018-09-21 ENCOUNTER — Ambulatory Visit: Payer: Self-pay | Admitting: Physician Assistant

## 2018-12-05 ENCOUNTER — Other Ambulatory Visit: Payer: Self-pay | Admitting: Physician Assistant

## 2019-02-08 ENCOUNTER — Other Ambulatory Visit: Payer: Self-pay | Admitting: Physician Assistant

## 2019-02-09 ENCOUNTER — Encounter: Payer: Self-pay | Admitting: Physician Assistant

## 2019-02-09 ENCOUNTER — Other Ambulatory Visit: Payer: Self-pay

## 2019-02-09 ENCOUNTER — Ambulatory Visit (INDEPENDENT_AMBULATORY_CARE_PROVIDER_SITE_OTHER): Payer: Medicare Other | Admitting: Physician Assistant

## 2019-02-09 VITALS — BP 137/77 | HR 87 | Temp 98.6°F | Wt 128.2 lb

## 2019-02-09 DIAGNOSIS — G894 Chronic pain syndrome: Secondary | ICD-10-CM

## 2019-02-09 DIAGNOSIS — F419 Anxiety disorder, unspecified: Secondary | ICD-10-CM | POA: Diagnosis not present

## 2019-02-09 DIAGNOSIS — F329 Major depressive disorder, single episode, unspecified: Secondary | ICD-10-CM

## 2019-02-09 DIAGNOSIS — F4321 Adjustment disorder with depressed mood: Secondary | ICD-10-CM | POA: Diagnosis not present

## 2019-02-09 DIAGNOSIS — J439 Emphysema, unspecified: Secondary | ICD-10-CM | POA: Diagnosis not present

## 2019-02-09 DIAGNOSIS — R4589 Other symptoms and signs involving emotional state: Secondary | ICD-10-CM | POA: Insufficient documentation

## 2019-02-09 DIAGNOSIS — K2100 Gastro-esophageal reflux disease with esophagitis, without bleeding: Secondary | ICD-10-CM

## 2019-02-09 MED ORDER — OMEPRAZOLE 20 MG PO CPDR: 20.0000 mg | DELAYED_RELEASE_CAPSULE | Freq: Two times a day (BID) | ORAL | 3 refills | Status: DC

## 2019-02-09 MED ORDER — FLUTICASONE-UMECLIDIN-VILANT 100-62.5-25 MCG/INH IN AEPB: 1.0000 | INHALATION_SPRAY | Freq: Every day | RESPIRATORY_TRACT | 5 refills | Status: DC

## 2019-02-09 MED ORDER — CLONAZEPAM 0.5 MG PO TABS: ORAL_TABLET | ORAL | 0 refills | Status: DC

## 2019-02-09 NOTE — Patient Instructions (Signed)
Hospice for grief counseling.
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
## 2019-05-31 LAB — BASIC METABOLIC PANEL
BUN: 10 (ref 4–21)
CO2: 22 (ref 13–22)
Chloride: 104 (ref 99–108)
Creatinine: 0.9 (ref 0.6–1.3)
Glucose: 115
Potassium: 3.9 (ref 3.4–5.3)
Sodium: 137 (ref 137–147)

## 2019-05-31 LAB — COMPREHENSIVE METABOLIC PANEL
Calcium: 9 (ref 8.7–10.7)
GFR calc Af Amer: 120
GFR calc non Af Amer: 104

## 2019-06-01 ENCOUNTER — Encounter: Payer: Self-pay | Admitting: Sports Medicine

## 2019-06-01 ENCOUNTER — Other Ambulatory Visit: Payer: Self-pay

## 2019-06-01 ENCOUNTER — Ambulatory Visit (INDEPENDENT_AMBULATORY_CARE_PROVIDER_SITE_OTHER): Payer: Medicare Other | Admitting: Sports Medicine

## 2019-06-01 DIAGNOSIS — F419 Anxiety disorder, unspecified: Secondary | ICD-10-CM | POA: Diagnosis not present

## 2019-06-01 DIAGNOSIS — J439 Emphysema, unspecified: Secondary | ICD-10-CM | POA: Diagnosis not present

## 2019-06-01 DIAGNOSIS — F4320 Adjustment disorder, unspecified: Secondary | ICD-10-CM

## 2019-06-01 DIAGNOSIS — F4321 Adjustment disorder with depressed mood: Secondary | ICD-10-CM

## 2019-06-01 MED ORDER — TRELEGY ELLIPTA 100-62.5-25 MCG/INH IN AEPB: 1.0000 | INHALATION_SPRAY | Freq: Every day | RESPIRATORY_TRACT | 5 refills | Status: DC

## 2019-06-01 MED ORDER — CLONAZEPAM 0.5 MG PO TABS: ORAL_TABLET | ORAL | 0 refills | Status: DC

## 2019-06-01 NOTE — Progress Notes (Signed)
Subjective:    CC: Anxiety  HPI: Manuel Cortez is a pleasant 43 year old male with chronic pain, currently seen in a pain clinic, as well as moderate to severe anxiety.  His father is terminally ill, and has adopted more of a hospice approach, he is having increasing anxiety, uneasiness, mild depressed mood.  He has done well with Klonopin in the past, last prescription was several months ago.  He does need a refill.  No suicidal or homicidal ideation.  I reviewed the past medical history, family history, social history, surgical history, and allergies today and no changes were needed.  Please see the problem list section below in epic for further details.  Past Medical History: Past Medical History:  Diagnosis Date  . Arthritis   . Bipolar 1 disorder, mixed ([place]) 2005   Not on meds.  Marland Kitchen COPD (chronic obstructive pulmonary disease) ([place])   . Gastric ulcer 15 years  . GERD (gastroesophageal reflux disease)   . Nephrolithiasis    Past Surgical History: Past Surgical History:  Procedure Laterality Date  . Lymph node removal     Social History: Social History   Socioeconomic History  . Marital status: Married    Spouse name: Not on file  . Number of children: 3  . Years of education: Not on file  . Highest education level: Not on file  Occupational History    Comment: Disability  Social Needs  . Financial resource strain: Not on file  . Food insecurity    Worry: Not on file    Inability: Not on file  . Transportation needs    Medical: Not on file    Non-medical: Not on file  Tobacco Use  . Smoking status: Current Some Day Smoker    Packs/day: 0.10    Types: Cigarettes  . Smokeless tobacco: Never Used  Substance and Sexual Activity  . Alcohol use: Yes    Alcohol/week: 5.0 standard drinks    Types: 5 Cans of beer per week  . Drug use: Yes    Types: Marijuana  . Sexual activity: Not on file  Lifestyle  . Physical activity    Days per week: Not on file    Minutes per  session: Not on file  . Stress: Not on file  Relationships  . Social Herbalist on phone: Not on file    Gets together: Not on file    Attends religious service: Not on file    Active member of club or organization: Not on file    Attends meetings of clubs or organizations: Not on file    Relationship status: Not on file  Other Topics Concern  . Not on file  Social History Narrative  . Not on file   Family History: Family History  Problem Relation Age of Onset  . Stroke Mother   . Alcohol abuse Father   . Cancer Father   . Heart attack Father        MI in his 21s  . Depression Father   . Diabetes Father   . Hyperlipidemia Father   . Hypertension Father    Allergies: Allergies  Allergen Reactions  . Anoro Ellipta [Umeclidinium-Vilanterol]     Chest feel funny  . Nsaids     gastritis  . Symbicort [Budesonide-Formoterol Fumarate]     Does not like the way he feels after taking inhaler.    Medications: See med rec.  Review of Systems: No fevers, chills, night sweats, weight loss, chest  pain, or shortness of breath.   Objective:    General: Well Developed, well nourished, and in no acute distress.  Neuro: Alert and oriented x3, extra-ocular muscles intact, sensation grossly intact.  HEENT: Normocephalic, atraumatic, pupils equal round reactive to light, neck supple, no masses, no lymphadenopathy, thyroid nonpalpable.  Skin: Warm and dry, no rashes. Cardiac: Regular rate and rhythm, no murmurs rubs or gallops, no lower extremity edema.  Respiratory: Clear to auscultation bilaterally. Not using accessory muscles, speaking in full sentences.  Impression and Recommendations:    Grief reaction Anticipatory grieving for his father. Refilling Klonopin, previous prescription was several months ago.  I spent 25 minutes with this patient, greater than 50% was face-to-face time counseling regarding the above diagnoses.  ___________________________________________  Ihor Austinhomas J. Benjamin Stainhekkekandam, M.D., ABFM., CAQSM. Primary Care and Sports Medicine [facility]  Adjunct Professor of Family Medicine  [employer]

## 2019-06-01 NOTE — Assessment & Plan Note (Signed)
Anticipatory grieving for his father. Refilling Klonopin, previous prescription was several months ago.

## 2019-09-19 ENCOUNTER — Other Ambulatory Visit: Payer: Self-pay | Admitting: Physician Assistant

## 2019-09-19 DIAGNOSIS — K2100 Gastro-esophageal reflux disease with esophagitis, without bleeding: Secondary | ICD-10-CM

## 2020-02-21 ENCOUNTER — Other Ambulatory Visit: Payer: Self-pay | Admitting: Neurology

## 2020-02-21 DIAGNOSIS — J439 Emphysema, unspecified: Secondary | ICD-10-CM

## 2020-02-21 MED ORDER — TRELEGY ELLIPTA 100-62.5-25 MCG/INH IN AEPB: 1.0000 | INHALATION_SPRAY | Freq: Every day | RESPIRATORY_TRACT | 0 refills | Status: DC

## 2020-04-18 ENCOUNTER — Telehealth: Payer: Self-pay | Admitting: Neurology

## 2020-04-18 NOTE — Telephone Encounter (Signed)
Appointment has been made

## 2020-04-18 NOTE — Telephone Encounter (Signed)
Received disability paperwork for patient. He has not been seen by [facility] in over a year. Please call patient to schedule an appointment (can be virtual if needed) so Lesly Rubenstein can complete. Thanks.

## 2020-04-21 ENCOUNTER — Other Ambulatory Visit: Payer: Self-pay

## 2020-04-21 ENCOUNTER — Ambulatory Visit (INDEPENDENT_AMBULATORY_CARE_PROVIDER_SITE_OTHER): Payer: Medicare Other | Admitting: Physician Assistant

## 2020-04-21 ENCOUNTER — Encounter: Payer: Self-pay | Admitting: Physician Assistant

## 2020-04-21 VITALS — BP 151/93 | HR 81 | Ht 69.0 in | Wt 133.0 lb

## 2020-04-21 DIAGNOSIS — M4802 Spinal stenosis, cervical region: Secondary | ICD-10-CM

## 2020-04-21 DIAGNOSIS — Z720 Tobacco use: Secondary | ICD-10-CM

## 2020-04-21 DIAGNOSIS — K2100 Gastro-esophageal reflux disease with esophagitis, without bleeding: Secondary | ICD-10-CM

## 2020-04-21 DIAGNOSIS — F319 Bipolar disorder, unspecified: Secondary | ICD-10-CM

## 2020-04-21 DIAGNOSIS — J439 Emphysema, unspecified: Secondary | ICD-10-CM

## 2020-04-21 DIAGNOSIS — G894 Chronic pain syndrome: Secondary | ICD-10-CM

## 2020-04-21 DIAGNOSIS — F419 Anxiety disorder, unspecified: Secondary | ICD-10-CM

## 2020-04-21 MED ORDER — BUSPIRONE HCL 5 MG PO TABS: 5.0000 mg | ORAL_TABLET | Freq: Three times a day (TID) | ORAL | 2 refills | Status: DC

## 2020-04-21 MED ORDER — ALBUTEROL SULFATE HFA 108 (90 BASE) MCG/ACT IN AERS: INHALATION_SPRAY | RESPIRATORY_TRACT | 3 refills | Status: DC

## 2020-04-21 MED ORDER — TRELEGY ELLIPTA 100-62.5-25 MCG/INH IN AEPB: 1.0000 | INHALATION_SPRAY | Freq: Every day | RESPIRATORY_TRACT | 11 refills | Status: DC
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
## 2020-05-02 ENCOUNTER — Other Ambulatory Visit: Payer: Self-pay

## 2020-05-02 DIAGNOSIS — K2100 Gastro-esophageal reflux disease with esophagitis, without bleeding: Secondary | ICD-10-CM

## 2020-05-02 MED ORDER — OMEPRAZOLE 20 MG PO CPDR: 20.0000 mg | DELAYED_RELEASE_CAPSULE | Freq: Two times a day (BID) | ORAL | 3 refills | Status: DC

## 2021-08-15 ENCOUNTER — Ambulatory Visit (INDEPENDENT_AMBULATORY_CARE_PROVIDER_SITE_OTHER): Payer: Medicare Other | Admitting: Physician Assistant

## 2021-08-15 ENCOUNTER — Encounter: Payer: Self-pay | Admitting: Physician Assistant

## 2021-08-15 ENCOUNTER — Other Ambulatory Visit: Payer: Self-pay

## 2021-08-15 VITALS — BP 134/86 | HR 104 | Ht 69.0 in | Wt 116.0 lb

## 2021-08-15 DIAGNOSIS — Z720 Tobacco use: Secondary | ICD-10-CM

## 2021-08-15 DIAGNOSIS — J432 Centrilobular emphysema: Secondary | ICD-10-CM | POA: Diagnosis not present

## 2021-08-15 DIAGNOSIS — K2100 Gastro-esophageal reflux disease with esophagitis, without bleeding: Secondary | ICD-10-CM | POA: Diagnosis not present

## 2021-08-15 DIAGNOSIS — Z13220 Encounter for screening for lipoid disorders: Secondary | ICD-10-CM

## 2021-08-15 DIAGNOSIS — Z1329 Encounter for screening for other suspected endocrine disorder: Secondary | ICD-10-CM

## 2021-08-15 DIAGNOSIS — Z1211 Encounter for screening for malignant neoplasm of colon: Secondary | ICD-10-CM

## 2021-08-15 DIAGNOSIS — F319 Bipolar disorder, unspecified: Secondary | ICD-10-CM

## 2021-08-15 DIAGNOSIS — Z0000 Encounter for general adult medical examination without abnormal findings: Secondary | ICD-10-CM | POA: Diagnosis not present

## 2021-08-15 DIAGNOSIS — F419 Anxiety disorder, unspecified: Secondary | ICD-10-CM

## 2021-08-15 DIAGNOSIS — M21612 Bunion of left foot: Secondary | ICD-10-CM

## 2021-08-15 DIAGNOSIS — J439 Emphysema, unspecified: Secondary | ICD-10-CM

## 2021-08-15 DIAGNOSIS — Z131 Encounter for screening for diabetes mellitus: Secondary | ICD-10-CM

## 2021-08-15 MED ORDER — TRELEGY ELLIPTA 100-62.5-25 MCG/INH IN AEPB: 1.0000 | INHALATION_SPRAY | Freq: Every day | RESPIRATORY_TRACT | 11 refills | Status: DC

## 2021-08-15 MED ORDER — OMEPRAZOLE 20 MG PO CPDR: 20.0000 mg | DELAYED_RELEASE_CAPSULE | Freq: Two times a day (BID) | ORAL | 3 refills | Status: DC

## 2021-08-15 MED ORDER — DICLOFENAC SODIUM 1 % EX GEL: 4.0000 g | Freq: Four times a day (QID) | CUTANEOUS | 1 refills | Status: AC

## 2021-08-15 MED ORDER — ALBUTEROL SULFATE HFA 108 (90 BASE) MCG/ACT IN AERS: INHALATION_SPRAY | RESPIRATORY_TRACT | 3 refills | Status: DC

## 2021-08-15 MED ORDER — BUSPIRONE HCL 10 MG PO TABS: 10.0000 mg | ORAL_TABLET | Freq: Three times a day (TID) | ORAL | 1 refills | Status: DC

## 2021-08-15 NOTE — Patient Instructions (Addendum)
Referral to podiatry  Health Maintenance, Male Adopting a healthy lifestyle and getting preventive care are important in promoting health and wellness. Ask your health care provider about: The right schedule for you to have regular tests and exams. Things you can do on your own to prevent diseases and keep yourself healthy. What should I know about diet, weight, and exercise? Eat a healthy diet  Eat a diet that includes plenty of vegetables, fruits, low-fat dairy products, and lean protein. Do not eat a lot of foods that are high in solid fats, added sugars, or sodium. Maintain a healthy weight Body mass index (BMI) is a measurement that can be used to identify possible weight problems. It estimates body fat based on height and weight. Your health care provider can help determine your BMI and help you achieve or maintain a healthy weight. Get regular exercise Get regular exercise. This is one of the most important things you can do for your health. Most adults should: Exercise for at least 150 minutes each week. The exercise should increase your heart rate and make you sweat (moderate-intensity exercise). Do strengthening exercises at least twice a week. This is in addition to the moderate-intensity exercise. Spend less time sitting. Even light physical activity can be beneficial. Watch cholesterol and blood lipids Have your blood tested for lipids and cholesterol at 47 years of age, then have this test every 5 years. You may need to have your cholesterol levels checked more often if: Your lipid or cholesterol levels are high. You are older than 47 years of age. You are at high risk for heart disease. What should I know about cancer screening? Many types of cancers can be detected early and may often be prevented. Depending on your health history and family history, you may need to have cancer screening at various ages. This may include screening for: Colorectal cancer. Prostate  cancer. Skin cancer. Lung cancer. What should I know about heart disease, diabetes, and high blood pressure? Blood pressure and heart disease High blood pressure causes heart disease and increases the risk of stroke. This is more likely to develop in people who have high blood pressure readings, are of African descent, or are overweight. Talk with your health care provider about your target blood pressure readings. Have your blood pressure checked: Every 3-5 years if you are 55-76 years of age. Every year if you are 77 years old or older. If you are between the ages of 32 and 71 and are a current or former smoker, ask your health care provider if you should have a one-time screening for abdominal aortic aneurysm (AAA). Diabetes Have regular diabetes screenings. This checks your fasting blood sugar level. Have the screening done: Once every three years after age 39 if you are at a normal weight and have a low risk for diabetes. More often and at a younger age if you are overweight or have a high risk for diabetes. What should I know about preventing infection? Hepatitis B If you have a higher risk for hepatitis B, you should be screened for this virus. Talk with your health care provider to find out if you are at risk for hepatitis B infection. Hepatitis C Blood testing is recommended for: Everyone born from [birth year] through 1967. Anyone with known risk factors for hepatitis C. Sexually transmitted infections (STIs) You should be screened each year for STIs, including gonorrhea and chlamydia, if: You are sexually active and are younger than 47 years of age. You are  older than 47 years of age and your health care provider tells you that you are at risk for this type of infection. Your sexual activity has changed since you were last screened, and you are at increased risk for chlamydia or gonorrhea. Ask your health care provider if you are at risk. Ask your health care provider about whether you  are at high risk for HIV. Your health care provider may recommend a prescription medicine to help prevent HIV infection. If you choose to take medicine to prevent HIV, you should first get tested for HIV. You should then be tested every 3 months for as long as you are taking the medicine. Follow these instructions at home: Lifestyle Do not use any products that contain nicotine or tobacco, such as cigarettes, e-cigarettes, and chewing tobacco. If you need help quitting, ask your health care provider. Do not use street drugs. Do not share needles. Ask your health care provider for help if you need support or information about quitting drugs. Alcohol use Do not drink alcohol if your health care provider tells you not to drink. If you drink alcohol: Limit how much you have to 0-2 drinks a day. Be aware of how much alcohol is in your drink. In the [place] one drink equals one 12 oz bottle of beer (355 mL), one 5 oz glass of wine (148 mL), or one 1 oz glass of hard liquor (44 mL). General instructions Schedule regular health, dental, and eye exams. Stay current with your vaccines. Tell your health care provider if: You often feel depressed. You have ever been abused or do not feel safe at home. Summary Adopting a healthy lifestyle and getting preventive care are important in promoting health and wellness. Follow your health care provider's instructions about healthy diet, exercising, and getting tested or screened for diseases. Follow your health care provider's instructions on monitoring your cholesterol and blood pressure. This information is not intended to replace advice given to you by your health care provider. Make sure you discuss any questions you have with your health care provider. Document Revised: 01/19/2021 Document Reviewed: 11/04/2018 Elsevier Patient Education  2022 [employer].
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
## 2021-08-16 LAB — LIPID PANEL W/REFLEX DIRECT LDL
Cholesterol: 167 mg/dL (ref <200)
HDL: 51 mg/dL (ref >=40)
LDL Cholesterol (Calc): 100 mg/dL (calc) — ABNORMAL HIGH
Non-HDL Cholesterol (Calc): 116 mg/dL (calc) (ref <130)
Total CHOL/HDL Ratio: 3.3 (calc) (ref <5.0)
Triglycerides: 74 mg/dL (ref <150)

## 2021-08-16 LAB — COMPLETE METABOLIC PANEL WITH GFR
AG Ratio: 1.7 (calc) (ref 1.0–2.5)
ALT: 7 U/L — ABNORMAL LOW (ref 9–46)
AST: 16 U/L (ref 10–40)
Albumin: 4.5 g/dL (ref 3.6–5.1)
Alkaline phosphatase (APISO): 81 U/L (ref 36–130)
BUN: 15 mg/dL (ref 7–25)
CO2: 30 mmol/L (ref 20–32)
Calcium: 9.6 mg/dL (ref 8.6–10.3)
Chloride: 101 mmol/L (ref 98–110)
Creat: 0.89 mg/dL (ref 0.60–1.29)
Globulin: 2.6 g/dL (calc) (ref 1.9–3.7)
Glucose, Bld: 100 mg/dL — ABNORMAL HIGH (ref 65–99)
Potassium: 4.2 mmol/L (ref 3.5–5.3)
Sodium: 140 mmol/L (ref 135–146)
Total Bilirubin: 0.4 mg/dL (ref 0.2–1.2)
Total Protein: 7.1 g/dL (ref 6.1–8.1)
eGFR: 106 mL/min/{1.73_m2} (ref >=60)

## 2021-08-16 LAB — TSH: TSH: 1.1 mIU/L (ref 0.40–4.50)

## 2021-08-17 NOTE — Progress Notes (Signed)
Thyroid looks good.  Cholesterol looks good.  Kidney and liver look good.  Fasting glucose just hair over the normal.

## 2021-08-20 ENCOUNTER — Ambulatory Visit: Payer: Medicare Other | Admitting: Physician Assistant

## 2021-08-24 ENCOUNTER — Ambulatory Visit (INDEPENDENT_AMBULATORY_CARE_PROVIDER_SITE_OTHER): Payer: Medicare Other | Admitting: Podiatry

## 2021-08-24 DIAGNOSIS — M79672 Pain in left foot: Secondary | ICD-10-CM

## 2021-08-30 ENCOUNTER — Ambulatory Visit (INDEPENDENT_AMBULATORY_CARE_PROVIDER_SITE_OTHER): Payer: Medicare Other | Admitting: Podiatry

## 2021-08-30 DIAGNOSIS — M2011 Hallux valgus (acquired), right foot: Secondary | ICD-10-CM

## 2022-05-01 ENCOUNTER — Telehealth: Payer: Medicare Other | Admitting: Physician Assistant

## 2022-05-01 ENCOUNTER — Telehealth: Payer: Self-pay | Admitting: Neurology

## 2022-05-01 DIAGNOSIS — B370 Candidal stomatitis: Secondary | ICD-10-CM

## 2022-05-01 MED ORDER — NYSTATIN 100000 UNIT/ML MT SUSP: 5.0000 mL | Freq: Four times a day (QID) | OROMUCOSAL | 0 refills | Status: DC

## 2022-05-01 MED ORDER — NYSTATIN 100000 UNIT/ML MT SUSP: 5.0000 mL | Freq: Four times a day (QID) | OROMUCOSAL | 0 refills | Status: AC

## 2022-05-01 NOTE — Patient Instructions (Addendum)
  Manuel Cortez., thank you for joining Piedad Climes, PA-C for today's virtual visit.  While this provider is not your primary care provider (PCP), if your PCP is located in our provider database this encounter information will be shared with them immediately following your visit.  Consent: (Patient) Manuel Cortez. provided verbal consent for this virtual visit at the beginning of the encounter.  Current Medications:  Current Outpatient Medications:    albuterol (PROVENTIL) (2.5 MG/3ML) 0.083% nebulizer solution, inhale contents of 1 vial in nebulizer every 6 hours if needed for wheezing or shortness of breath, Disp: 150 mL, Rfl: 1   albuterol (VENTOLIN HFA) 108 (90 Base) MCG/ACT inhaler, inhale 2 puffs by mouth every 6 hours if needed for wheezing, Disp: 18 g, Rfl: 3   busPIRone (BUSPAR) 10 MG tablet, Take 1 tablet (10 mg total) by mouth 3 (three) times daily., Disp: 270 tablet, Rfl: 1   diclofenac Sodium (VOLTAREN) 1 % GEL, Apply 4 g topically 4 (four) times daily. To affected joint., Disp: 100 g, Rfl: 1   Fluticasone-Umeclidin-Vilant (TRELEGY ELLIPTA) 100-62.5-25 MCG/INH AEPB, Inhale 1 puff into the lungs daily., Disp: 60 each, Rfl: 11   nystatin (MYCOSTATIN) 100000 UNIT/ML suspension, Take 5 mLs (500,000 Units total) by mouth 4 (four) times daily. X 1 week. Swish and hold in mouth for at least 2 minutes and then swallow., Disp: 473 mL, Rfl: 0   omeprazole (PRILOSEC) 20 MG capsule, Take 1 capsule (20 mg total) by mouth 2 (two) times daily., Disp: 180 capsule, Rfl: 3   oxyCODONE (OXYCONTIN) 10 mg 12 hr tablet, Take 10 mg by mouth. Takes 6 tablets per day, managed by pain management, Disp: , Rfl:    Oxycodone HCl 10 MG TABS, SMARTSIG:1 Tablet(s) By Mouth 6 Times Daily, Disp: , Rfl:    Medications ordered in this encounter:  Meds ordered this encounter  Medications   nystatin (MYCOSTATIN) 100000 UNIT/ML suspension    Sig: Take 5 mLs (500,000 Units total) by mouth 4 (four) times daily.  X 1 week. Swish and hold in mouth for at least 2 minutes and then swallow.    Dispense:  473 mL    Refill:  0    Order Specific Question:   Supervising Provider    Answer:   Hyacinth Meeker, BRIAN [[id number]]     *If you need refills on other medications prior to your next appointment, please contact your pharmacy*  Follow-Up: Call back or seek an in-person evaluation if the symptoms worsen or if the condition fails to improve as anticipated.  Other Instructions Please keep hydrated.  Make sure to rinse your mouth out well after each use of the Trelegy.  Start the Nystatin mouthwash as directed.     If you have been instructed to have an in-person evaluation today at a local Urgent Care facility, please use the link below. It will take you to a list of all of our available [facility], including address, phone number and hours of operation. Please do not delay care.  [facility]  If you or a family member do not have a primary care provider, use the link below to schedule a visit and establish care. When you choose a [facility] primary care physician or advanced practice provider, you gain a long-term partner in health. Find a Primary Care Provider  Learn more about [facility]'s in-office and virtual care options: [facility] - Get Care Now

## 2022-05-01 NOTE — Telephone Encounter (Signed)
Patient's wife called and lvm stating patient has thrush from Trelegy and asking for RX. Please advise. [phone].
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
## 2022-05-02 ENCOUNTER — Telehealth: Payer: Self-pay

## 2022-05-02 NOTE — Telephone Encounter (Signed)
Pt's spouse called stating that pt has thrush and is requesting call back.  Returned pt's call this morning and received VM.  LVM that virtual visit noted in pt's chart.  Advised that if we can be of further assistance, give [place] a call back.  Tiajuana Amass, CMA

## 2022-05-02 NOTE — Telephone Encounter (Signed)
LVM letting patient know RX sent to pharmacy. ?

## 2022-07-17 ENCOUNTER — Encounter: Payer: Self-pay | Admitting: General Practice

## 2022-11-20 ENCOUNTER — Telehealth: Payer: Self-pay | Admitting: Physician Assistant

## 2022-11-20 DIAGNOSIS — J439 Emphysema, unspecified: Secondary | ICD-10-CM

## 2022-11-20 DIAGNOSIS — K2100 Gastro-esophageal reflux disease with esophagitis, without bleeding: Secondary | ICD-10-CM

## 2022-11-21 NOTE — Telephone Encounter (Signed)
Please contact the patient to schedule appt with PCP. Last seen 2022. Thanks

## 2022-11-21 NOTE — Telephone Encounter (Signed)
Called patient, I got the message "call can not be completed as dialed", will try again later.

## 2023-02-01 ENCOUNTER — Other Ambulatory Visit: Payer: Self-pay | Admitting: Physician Assistant

## 2023-02-01 DIAGNOSIS — K2100 Gastro-esophageal reflux disease with esophagitis, without bleeding: Secondary | ICD-10-CM

## 2023-02-03 ENCOUNTER — Other Ambulatory Visit: Payer: Self-pay | Admitting: Neurology

## 2023-02-03 DIAGNOSIS — K2100 Gastro-esophageal reflux disease with esophagitis, without bleeding: Secondary | ICD-10-CM

## 2023-02-03 MED ORDER — OMEPRAZOLE 20 MG PO CPDR: DELAYED_RELEASE_CAPSULE | ORAL | 0 refills | Status: DC

## 2023-03-21 ENCOUNTER — Other Ambulatory Visit: Payer: Self-pay | Admitting: Physician Assistant

## 2023-03-21 DIAGNOSIS — J439 Emphysema, unspecified: Secondary | ICD-10-CM

## 2023-04-16 ENCOUNTER — Telehealth: Payer: Self-pay | Admitting: General Practice

## 2023-04-16 NOTE — Transitions of Care (Post Inpatient/ED Visit) (Signed)
   04/16/2023  Name: Manuel Cortez. MRN: [id number] DOB: [birth year]  Today's TOC FU Call Status: Today's TOC FU Call Status:: Unsuccessul Call (1st Attempt) Unsuccessful Call (1st Attempt) Date: 04/16/23  Attempted to reach the patient regarding the most recent Inpatient/ED visit.  Follow Up Plan: Additional outreach attempts will be made to reach the patient to complete the Transitions of Care (Post Inpatient/ED visit) call.   Signature Modesto Charon, RN BSN

## 2023-04-22 NOTE — Transitions of Care (Post Inpatient/ED Visit) (Signed)
   04/22/2023  Name: Manuel Cortez. MRN: [id number] DOB: September 16, 1976  Today's TOC FU Call Status: Today's TOC FU Call Status:: Unsuccessful Call (2nd Attempt) Unsuccessful Call (1st Attempt) Date: 04/16/23 Unsuccessful Call (2nd Attempt) Date: 04/22/23  Attempted to reach the patient regarding the most recent Inpatient/ED visit.  Follow Up Plan: Additional outreach attempts will be made to reach the patient to complete the Transitions of Care (Post Inpatient/ED visit) call.   Signature Modesto Charon, RN BSN

## 2023-04-23 NOTE — Transitions of Care (Post Inpatient/ED Visit) (Signed)
   04/23/2023  Name: Manuel Cortez. MRN: [id number] DOB: 03-Jul-1976  Today's TOC FU Call Status: Today's TOC FU Call Status:: Unsuccessful Call (3rd Attempt) Unsuccessful Call (1st Attempt) Date: 04/16/23 Unsuccessful Call (2nd Attempt) Date: 04/22/23 Unsuccessful Call (3rd Attempt) Date: 04/23/23  Attempted to reach the patient regarding the most recent Inpatient/ED visit.  Follow Up Plan: No further outreach attempts will be made at this time. We have been unable to contact the patient.  Signature Modesto Charon, RN BSN

## 2023-04-25 ENCOUNTER — Other Ambulatory Visit: Payer: Self-pay | Admitting: Physician Assistant

## 2023-04-25 DIAGNOSIS — J439 Emphysema, unspecified: Secondary | ICD-10-CM

## 2023-07-04 ENCOUNTER — Other Ambulatory Visit: Payer: Self-pay | Admitting: *Deleted

## 2023-07-04 DIAGNOSIS — K2100 Gastro-esophageal reflux disease with esophagitis, without bleeding: Secondary | ICD-10-CM

## 2023-07-04 MED ORDER — OMEPRAZOLE 20 MG PO CPDR: DELAYED_RELEASE_CAPSULE | ORAL | 0 refills | Status: DC

## 2023-07-07 ENCOUNTER — Other Ambulatory Visit: Payer: Self-pay | Admitting: Physician Assistant

## 2023-07-07 DIAGNOSIS — J439 Emphysema, unspecified: Secondary | ICD-10-CM

## 2024-01-29 DIAGNOSIS — F319 Bipolar disorder, unspecified: Secondary | ICD-10-CM | POA: Diagnosis not present

## 2024-01-29 DIAGNOSIS — J438 Other emphysema: Secondary | ICD-10-CM | POA: Diagnosis not present

## 2024-01-29 DIAGNOSIS — K1370 Unspecified lesions of oral mucosa: Secondary | ICD-10-CM | POA: Diagnosis not present

## 2024-01-29 DIAGNOSIS — F17210 Nicotine dependence, cigarettes, uncomplicated: Secondary | ICD-10-CM | POA: Diagnosis not present

## 2024-01-29 DIAGNOSIS — K0510 Chronic gingivitis, plaque induced: Secondary | ICD-10-CM | POA: Diagnosis not present

## 2024-02-04 ENCOUNTER — Ambulatory Visit: Admitting: Physician Assistant

## 2024-02-10 ENCOUNTER — Ambulatory Visit (INDEPENDENT_AMBULATORY_CARE_PROVIDER_SITE_OTHER): Admitting: Physician Assistant

## 2024-02-10 VITALS — BP 139/79 | HR 89 | Ht 69.0 in | Wt 115.8 lb

## 2024-02-10 DIAGNOSIS — K2100 Gastro-esophageal reflux disease with esophagitis, without bleeding: Secondary | ICD-10-CM | POA: Diagnosis not present

## 2024-02-10 DIAGNOSIS — F419 Anxiety disorder, unspecified: Secondary | ICD-10-CM | POA: Diagnosis not present

## 2024-02-10 DIAGNOSIS — J439 Emphysema, unspecified: Secondary | ICD-10-CM

## 2024-02-10 DIAGNOSIS — F32A Depression, unspecified: Secondary | ICD-10-CM

## 2024-02-10 DIAGNOSIS — M4802 Spinal stenosis, cervical region: Secondary | ICD-10-CM | POA: Diagnosis not present

## 2024-02-10 MED ORDER — BUSPIRONE HCL 10 MG PO TABS: 10.0000 mg | ORAL_TABLET | Freq: Two times a day (BID) | ORAL | 0 refills | Status: DC

## 2024-02-10 MED ORDER — OMEPRAZOLE 20 MG PO CPDR: DELAYED_RELEASE_CAPSULE | ORAL | 3 refills | Status: DC

## 2024-02-10 MED ORDER — TRELEGY ELLIPTA 100-62.5-25 MCG/ACT IN AEPB: INHALATION_SPRAY | RESPIRATORY_TRACT | 5 refills | Status: AC

## 2024-02-10 MED ORDER — GABAPENTIN 100 MG PO CAPS: 100.0000 mg | ORAL_CAPSULE | Freq: Three times a day (TID) | ORAL | 2 refills | Status: DC

## 2024-02-10 MED ORDER — CLONAZEPAM 1 MG PO TABS: ORAL_TABLET | ORAL | 2 refills | Status: DC

## 2024-02-10 MED ORDER — DULOXETINE HCL 30 MG PO CPEP
30.0000 mg | ORAL_CAPSULE | Freq: Every day | ORAL | 2 refills | Status: DC
Start: 2024-02-10 — End: 2024-07-06

## 2024-02-10 MED ORDER — ALBUTEROL SULFATE HFA 108 (90 BASE) MCG/ACT IN AERS: INHALATION_SPRAY | RESPIRATORY_TRACT | 3 refills | Status: AC

## 2024-02-10 NOTE — Patient Instructions (Addendum)
 Start cymbalta and buspar for anxiety Start gabapentin for pain in arm  Klonapin as needed but no more than once a day.

## 2024-02-10 NOTE — Progress Notes (Unsigned)
 Established Patient Office Visit  Subjective   Patient ID: Manuel Cortez., male    DOB: [birth year]  Age: 48 y.o. MRN: [id number]  Chief Complaint  Patient presents with   Depression    HPI Patient is a 48 year old male with COPD and chronic pain due to cervical stenosis who presents to the clinic with his wife to discuss his mood.  He is always struggled with anxiety and depression.  More recently it seems to be worse.  He has not been taking any medications.  He seems to worry about everything.  He wants to take care of his family and feels like there is some burden on him to do so.  He worries so much that he will have chest pain and tightness.  He ended up going to the hospital for this and no cardiac cause was found.  He would like something for anxiety and depression. He mentions that clonazepam really helped him before.    He used to go to pain clinic but has not gone in months.   ROS See HPI.    Objective:     BP 139/79 (BP Location: Left Arm, Patient Position: Sitting, Cuff Size: Normal)   Pulse 89   Ht 5\' 9"  (1.753 m)   Wt 115 lb 12 oz (52.5 kg)   SpO2 96%   BMI 17.09 kg/m  BP Readings from Last 3 Encounters:  02/10/24 139/79  08/15/21 134/86  04/21/20 (!) 151/93   Wt Readings from Last 3 Encounters:  02/10/24 115 lb 12 oz (52.5 kg)  08/15/21 116 lb (52.6 kg)  04/21/20 133 lb (60.3 kg)    .Marland Kitchen    02/10/2024    4:11 PM 08/15/2021    1:47 PM 04/21/2020    1:26 PM 06/01/2019    3:41 PM 02/09/2019   11:21 AM  Depression screen PHQ 2/9  Decreased Interest 1 1 1 1 1   Down, Depressed, Hopeless 1 1 1 1 3   PHQ - 2 Score 2 2 2 2 4   Altered sleeping 1 1 0 1 2  Tired, decreased energy 1 1 1 1 1   Change in appetite 0 0 0 0 1  Feeling bad or failure about yourself  0 0 0 0 2  Trouble concentrating 0 1 0 0 2  Moving slowly or fidgety/restless 0 1 0 0 2  Suicidal thoughts 0 0 0 0 0  PHQ-9 Score 4 6 3 4 14   Difficult doing work/chores Somewhat difficult Somewhat  difficult Not difficult at all Somewhat difficult Very difficult   .Marland Kitchen    02/10/2024    4:12 PM 08/15/2021    1:47 PM 04/21/2020    1:27 PM 06/01/2019    3:42 PM  GAD 7 : Generalized Anxiety Score  Nervous, Anxious, on Edge 1 2 1 2   Control/stop worrying 1 2 1 2   Worry too much - different things 1 1 1 2   Trouble relaxing 1 1 0 1  Restless 0 0 0 1  Easily annoyed or irritable 0 1 0 0  Afraid - awful might happen 0 1 0 0  Total GAD 7 Score 4 8 3 8   Anxiety Difficulty Somewhat difficult Very difficult Somewhat difficult Somewhat difficult      Physical Exam Constitutional:      Appearance: Normal appearance.  HENT:     Head: Normocephalic.  Cardiovascular:     Rate and Rhythm: Normal rate and regular rhythm.  Pulmonary:     Effort: Pulmonary  effort is normal.     Breath sounds: Normal breath sounds.  Musculoskeletal:     Right lower leg: No edema.     Left lower leg: No edema.  Neurological:     General: No focal deficit present.     Mental Status: He is alert and oriented to person, place, and time.  Psychiatric:        Mood and Affect: Mood normal.        Assessment & Plan:  Marland KitchenMarland KitchenSimona Cortez "Ej" was seen today for depression.  Diagnoses and all orders for this visit:  Anxiety and depression -     DULoxetine (CYMBALTA) 30 MG capsule; Take 1 capsule (30 mg total) by mouth daily. -     busPIRone (BUSPAR) 10 MG tablet; Take 1 tablet (10 mg total) by mouth 2 (two) times daily. -     clonazePAM (KLONOPIN) 1 MG tablet; Take one tablet as needed for anxiety no more than one tablet a day.  Pulmonary emphysema, unspecified emphysema type (HCC) -     Fluticasone-Umeclidin-Vilant (TRELEGY ELLIPTA) 100-62.5-25 MCG/ACT AEPB; INHALE ONE PUFF INTO THE LUNGS DAILY -     albuterol (VENTOLIN HFA) 108 (90 Base) MCG/ACT inhaler; INHALE 2 PUFFS BY MOUTH EVERY 6 HOURS AS NEEDED FOR WHEEZING  Gastroesophageal reflux disease with esophagitis without hemorrhage -     omeprazole (PRILOSEC) 20 MG  capsule; TAKE 1 CAPSULE(20 MG) BY MOUTH TWICE DAILY  Cervical spinal stenosis, without cervical myelopathy -     DULoxetine (CYMBALTA) 30 MG capsule; Take 1 capsule (30 mg total) by mouth daily. -     gabapentin (NEURONTIN) 100 MG capsule; Take 1 capsule (100 mg total) by mouth 3 (three) times daily. For right arm pain.   Refilled medication  PHQ/GAD done with some elevation  Start cymbalta for pain and mood Start buspar for anxiety Start gabapentin for radicular pain and can help with anxiety some as well Use klonapin only as needed, discussed dependence risk   Return in about 2 months (around 04/11/2024) for mood follow up.    Tandy Gaw, PA-C

## 2024-02-11 DIAGNOSIS — F419 Anxiety disorder, unspecified: Secondary | ICD-10-CM | POA: Insufficient documentation

## 2024-03-29 ENCOUNTER — Ambulatory Visit: Admitting: Physician Assistant

## 2024-04-12 ENCOUNTER — Ambulatory Visit: Admitting: Physician Assistant

## 2024-05-14 ENCOUNTER — Other Ambulatory Visit: Payer: Self-pay | Admitting: Physician Assistant

## 2024-05-14 DIAGNOSIS — F419 Anxiety disorder, unspecified: Secondary | ICD-10-CM

## 2024-05-14 DIAGNOSIS — F32A Depression, unspecified: Secondary | ICD-10-CM

## 2024-05-14 NOTE — Telephone Encounter (Unsigned)
 Copied from CRM [id number]. Topic: Clinical - Medication Refill >> May 14, 2024 11:49 AM Kevelyn M wrote: Medication: clonazePAM  (KLONOPIN ) 1 MG tablet  Has the patient contacted their pharmacy? Yes (Agent: If no, request that the patient contact the pharmacy for the refill. If patient does not wish to contact the pharmacy document the reason why and proceed with request.) (Agent: If yes, when and what did the pharmacy advise?)  This is the patient's preferred pharmacy:  [facility] [address] [place], [place], [place] [address] [phone]   Is this the correct pharmacy for this prescription? Yes If no, delete pharmacy and type the correct one.   Has the prescription been filled recently? Yes  Is the patient out of the medication? Yes  Has the patient been seen for an appointment in the last year OR does the patient have an upcoming appointment? Yes  Can we respond through MyChart? Yes  Agent: Please be advised that Rx refills may take up to 3 business days. We ask that you follow-up with your pharmacy.

## 2024-05-17 MED ORDER — CLONAZEPAM 1 MG PO TABS
ORAL_TABLET | ORAL | 0 refills | Status: DC
Start: 2024-05-17 — End: 2024-07-06

## 2024-05-17 NOTE — Telephone Encounter (Signed)
 Called patient, NO ANSWER, VM FULL, will call back later today to schedule thanks.

## 2024-07-06 ENCOUNTER — Ambulatory Visit (INDEPENDENT_AMBULATORY_CARE_PROVIDER_SITE_OTHER): Admitting: Physician Assistant

## 2024-07-06 ENCOUNTER — Encounter: Payer: Self-pay | Admitting: Physician Assistant

## 2024-07-06 VITALS — BP 145/85 | HR 87 | Ht 69.0 in | Wt 113.0 lb

## 2024-07-06 DIAGNOSIS — F32A Depression, unspecified: Secondary | ICD-10-CM | POA: Diagnosis not present

## 2024-07-06 DIAGNOSIS — F410 Panic disorder [episodic paroxysmal anxiety] without agoraphobia: Secondary | ICD-10-CM | POA: Diagnosis not present

## 2024-07-06 DIAGNOSIS — F419 Anxiety disorder, unspecified: Secondary | ICD-10-CM | POA: Diagnosis not present

## 2024-07-06 DIAGNOSIS — M4802 Spinal stenosis, cervical region: Secondary | ICD-10-CM | POA: Diagnosis not present

## 2024-07-06 DIAGNOSIS — R350 Frequency of micturition: Secondary | ICD-10-CM

## 2024-07-06 DIAGNOSIS — F411 Generalized anxiety disorder: Secondary | ICD-10-CM | POA: Diagnosis not present

## 2024-07-06 DIAGNOSIS — F5101 Primary insomnia: Secondary | ICD-10-CM | POA: Diagnosis not present

## 2024-07-06 MED ORDER — GABAPENTIN 100 MG PO CAPS
100.0000 mg | ORAL_CAPSULE | Freq: Three times a day (TID) | ORAL | 0 refills | Status: DC
Start: 2024-07-06 — End: 2024-10-06

## 2024-07-06 MED ORDER — CLONAZEPAM 1 MG PO TABS
ORAL_TABLET | ORAL | 2 refills | Status: DC
Start: 2024-07-06 — End: 2024-09-06

## 2024-07-06 MED ORDER — BUSPIRONE HCL 10 MG PO TABS: 10.0000 mg | ORAL_TABLET | Freq: Two times a day (BID) | ORAL | 0 refills | Status: DC

## 2024-07-06 MED ORDER — MIRTAZAPINE 7.5 MG PO TABS: ORAL_TABLET | ORAL | 2 refills | Status: DC

## 2024-07-06 NOTE — Patient Instructions (Signed)
 Start remeron  for sleep  Increased klonapin 1mg  twice a day Take gabapentin  100mg  three times a day for pain Take buspar  10mg  twice a day for anxiety

## 2024-07-09 ENCOUNTER — Encounter: Payer: Self-pay | Admitting: Physician Assistant

## 2024-07-09 DIAGNOSIS — F411 Generalized anxiety disorder: Secondary | ICD-10-CM | POA: Insufficient documentation

## 2024-07-09 DIAGNOSIS — F5101 Primary insomnia: Secondary | ICD-10-CM | POA: Insufficient documentation

## 2024-07-09 DIAGNOSIS — F410 Panic disorder [episodic paroxysmal anxiety] without agoraphobia: Secondary | ICD-10-CM | POA: Insufficient documentation

## 2024-07-09 DIAGNOSIS — R350 Frequency of micturition: Secondary | ICD-10-CM | POA: Insufficient documentation

## 2024-07-09 NOTE — Progress Notes (Signed)
 Established Patient Office Visit  Subjective   Patient ID: Manuel Goettl., male    DOB: 08/01/1976  Age: 48 y.o. MRN: [id number]  Chief Complaint  Patient presents with   Medical Management of Chronic Issues    Anxiety and depression      HPI Pt is a 48 yo male who presents to the clinic to discuss anxiety, panic attacks, insomnia and chronic pain.   Pt admits he was not able to tolerate cymbalta -he did not like the way it made him feel. He was not taking buspar  and gabapentin  the way he was supposed too. Muscle relaxers make him too sleepy. He takes klonapin for panic attacks and anxiety. He does not want to go to [facility]. He is having panic attacks most days. He is not going or staying asleep very well. He feels like he is urinating more frequently but denies any pain with urination.    ROS See HPI.    Objective:     BP (!) 145/85   Pulse 87   Ht 5' 9 (1.753 m)   Wt 113 lb (51.3 kg)   SpO2 99%   BMI 16.69 kg/m  BP Readings from Last 3 Encounters:  07/06/24 (!) 145/85  02/10/24 139/79  08/15/21 134/86   Wt Readings from Last 3 Encounters:  07/06/24 113 lb (51.3 kg)  02/10/24 115 lb 12 oz (52.5 kg)  08/15/21 116 lb (52.6 kg)    ..    07/06/2024    2:51 PM 02/10/2024    4:11 PM 08/15/2021    1:47 PM 04/21/2020    1:26 PM 06/01/2019    3:41 PM  Depression screen PHQ 2/9  Decreased Interest 0 1 1 1 1   Down, Depressed, Hopeless 3 1 1 1 1   PHQ - 2 Score 3 2 2 2 2   Altered sleeping 3 1 1  0 1  Tired, decreased energy 0 1 1 1 1   Change in appetite 0 0 0 0 0  Feeling bad or failure about yourself  2 0 0 0 0  Trouble concentrating 0 0 1 0 0  Moving slowly or fidgety/restless 0 0 1 0 0  Suicidal thoughts 0 0 0 0 0  PHQ-9 Score 8 4 6 3 4   Difficult doing work/chores Somewhat difficult Somewhat difficult Somewhat difficult Not difficult at all Somewhat difficult   ..    07/06/2024    2:51 PM 02/10/2024    4:12 PM 08/15/2021    1:47 PM 04/21/2020    1:27 PM  GAD 7 :  Generalized Anxiety Score  Nervous, Anxious, on Edge 3 1 2 1   Control/stop worrying 3 1 2 1   Worry too much - different things 3 1 1 1   Trouble relaxing 3 1 1  0  Restless 2 0 0 0  Easily annoyed or irritable 0 0 1 0  Afraid - awful might happen 1 0 1 0  Total GAD 7 Score 15 4 8 3   Anxiety Difficulty Not difficult at all Somewhat difficult Very difficult Somewhat difficult      Physical Exam Constitutional:      Comments: Frail and thin  Cardiovascular:     Rate and Rhythm: Normal rate and regular rhythm.  Pulmonary:     Effort: Pulmonary effort is normal.  Neurological:     General: No focal deficit present.     Mental Status: He is alert.  Psychiatric:        Mood and Affect: Mood normal.  The 10-year ASCVD risk score (Arnett DK, et al., 2019) is: 6.7%    Assessment & Plan:  Manuel Cortez was seen today for medical management of chronic issues.  Diagnoses and all orders for this visit:  GAD (generalized anxiety disorder)  Frequent urination -     CMP14+EGFR -     PSA -     CBC w/Diff/Platelet -     TSH + free T4  Panic attacks  Primary insomnia -     mirtazapine  (REMERON ) 7.5 MG tablet; Take one tablet at bedtime for sleep.  Anxiety and depression -     busPIRone  (BUSPAR ) 10 MG tablet; Take 1 tablet (10 mg total) by mouth 2 (two) times daily. -     clonazePAM  (KLONOPIN ) 1 MG tablet; Take one tablet twice a day as needed for anxiety.  Cervical spinal stenosis, without cervical myelopathy -     gabapentin  (NEURONTIN ) 100 MG capsule; Take 1 capsule (100 mg total) by mouth 3 (three) times daily. For right arm pain.   Pt wanted sonata for sleep, declined today Start remeron  PHQ/GAD not to goal Increased buspar  to 10mg  twice a day Increased gabapentin  to 100mg  three times a day Increased klonapin to twice a day or as needed Not going to give him any controlled substance for pain while on klonapin Failed cymbalta  Pt left without getting a UA and labs to  look into reason for frequent urination I am concerned he could have some prostate enlargement.  Follow up in 3 months   Pt declined all health prevention vaccines and testing.   Return in about 3 months (around 10/06/2024).    Manuel Prather, PA-C

## 2024-07-13 ENCOUNTER — Ambulatory Visit: Admitting: Physician Assistant

## 2024-09-06 ENCOUNTER — Other Ambulatory Visit: Payer: Self-pay | Admitting: Physician Assistant

## 2024-09-06 DIAGNOSIS — F32A Depression, unspecified: Secondary | ICD-10-CM

## 2024-09-06 DIAGNOSIS — F5101 Primary insomnia: Secondary | ICD-10-CM

## 2024-09-06 NOTE — Telephone Encounter (Signed)
 CLONAZEPAM  Last OV: 07/06/24 Next OV: 10/06/24 Last RF: 07/06/24

## 2024-09-06 NOTE — Telephone Encounter (Signed)
 MIRTAZAPINE  Last OV: 07/06/24 Next OV: 10/06/24 Last RF: 07/06/24

## 2024-09-06 NOTE — Telephone Encounter (Signed)
..  PDMP reviewed during this encounter. Refilled clonazepam  for one more month until next appt.

## 2024-10-06 ENCOUNTER — Encounter: Payer: Self-pay | Admitting: Physician Assistant

## 2024-10-06 ENCOUNTER — Ambulatory Visit (INDEPENDENT_AMBULATORY_CARE_PROVIDER_SITE_OTHER): Admitting: Physician Assistant

## 2024-10-06 VITALS — BP 125/70 | HR 65 | Ht 69.0 in | Wt 115.0 lb

## 2024-10-06 DIAGNOSIS — F5101 Primary insomnia: Secondary | ICD-10-CM | POA: Diagnosis not present

## 2024-10-06 DIAGNOSIS — F410 Panic disorder [episodic paroxysmal anxiety] without agoraphobia: Secondary | ICD-10-CM | POA: Diagnosis not present

## 2024-10-06 DIAGNOSIS — F411 Generalized anxiety disorder: Secondary | ICD-10-CM | POA: Diagnosis not present

## 2024-10-06 DIAGNOSIS — M4802 Spinal stenosis, cervical region: Secondary | ICD-10-CM

## 2024-10-06 DIAGNOSIS — F419 Anxiety disorder, unspecified: Secondary | ICD-10-CM

## 2024-10-06 DIAGNOSIS — F32A Depression, unspecified: Secondary | ICD-10-CM | POA: Diagnosis not present

## 2024-10-06 DIAGNOSIS — K219 Gastro-esophageal reflux disease without esophagitis: Secondary | ICD-10-CM | POA: Diagnosis not present

## 2024-10-06 MED ORDER — GABAPENTIN 100 MG PO CAPS: 100.0000 mg | ORAL_CAPSULE | Freq: Three times a day (TID) | ORAL | 1 refills | Status: AC

## 2024-10-06 MED ORDER — CLONAZEPAM 1 MG PO TABS: ORAL_TABLET | ORAL | 5 refills | Status: AC

## 2024-10-06 MED ORDER — BUSPIRONE HCL 10 MG PO TABS: 10.0000 mg | ORAL_TABLET | Freq: Three times a day (TID) | ORAL | 1 refills | Status: AC

## 2024-10-06 MED ORDER — OMEPRAZOLE 40 MG PO CPDR: 40.0000 mg | DELAYED_RELEASE_CAPSULE | Freq: Every day | ORAL | 3 refills | Status: AC

## 2024-10-06 MED ORDER — MIRTAZAPINE 15 MG PO TABS: 15.0000 mg | ORAL_TABLET | Freq: Every day | ORAL | 1 refills | Status: AC

## 2024-10-06 NOTE — Progress Notes (Addendum)
 "  Established Patient Office Visit  Subjective   Patient ID: Manuel Enns., male    DOB: 1976/05/18  Age: 48 y.o. MRN: [id number]  Chief Complaint  Patient presents with   Medical Management of Chronic Issues    HPI .SABRADiscussed the use of AI scribe software for clinical note transcription with the patient, who gave verbal consent to proceed.  History of Present Illness Manuel Cortez is a 48 year old male who presents for follow-up regarding sleep and anxiety management.  Sleep disturbance - Improvement in sleep since initiation of mirtazapine  7.5 mg. - Able to sleep through the night initially after starting mirtazapine . - Currently experiences occasional nighttime awakenings.  Anxiety symptoms - Continues to experience anxiety. - Buspirone  taken three times daily provides some benefit. - Clonazepam  used as needed, up to twice daily, for anxiety management. - Gabapentin  prescribed for anxiety and pain, but taken inconsistently; dose is 100 mg three times daily, but he skips doses and sometimes days.  Gastrointestinal symptoms - Experiences stomach burning. - Associates symptoms with skipping doses of omeprazole .  Psychological stress - Acknowledges increased stress levels.    ROS See HPI.    Objective:     BP 125/70   Pulse 65   Ht 5' 9 (1.753 m)   Wt 115 lb (52.2 kg)   SpO2 99%   BMI 16.98 kg/m  BP Readings from Last 3 Encounters:  10/06/24 125/70  07/06/24 (!) 145/85  02/10/24 139/79   Wt Readings from Last 3 Encounters:  10/06/24 115 lb (52.2 kg)  07/06/24 113 lb (51.3 kg)  02/10/24 115 lb 12 oz (52.5 kg)    ..    10/06/2024    2:35 PM 07/06/2024    2:51 PM 02/10/2024    4:11 PM 08/15/2021    1:47 PM 04/21/2020    1:26 PM  Depression screen PHQ 2/9  Decreased Interest 1 0 1 1 1   Down, Depressed, Hopeless 1 3 1 1 1   PHQ - 2 Score 2 3 2 2 2   Altered sleeping 0 3 1 1  0  Tired, decreased energy 0 0 1 1 1   Change in appetite 0 0 0 0 0   Feeling bad or failure about yourself  0 2 0 0 0  Trouble concentrating 0 0 0 1 0  Moving slowly or fidgety/restless 0 0 0 1 0  Suicidal thoughts 0 0 0 0 0  PHQ-9 Score 2 8  4  6  3    Difficult doing work/chores Not difficult at all Somewhat difficult Somewhat difficult Somewhat difficult Not difficult at all     Data saved with a previous flowsheet row definition   ..    10/06/2024    2:35 PM 07/06/2024    2:51 PM 02/10/2024    4:12 PM 08/15/2021    1:47 PM  GAD 7 : Generalized Anxiety Score  Nervous, Anxious, on Edge 0 3 1 2   Control/stop worrying 0 3 1 2   Worry too much - different things 0 3 1 1   Trouble relaxing 0 3 1 1   Restless 0 2 0 0  Easily annoyed or irritable 0 0 0 1  Afraid - awful might happen 0 1 0 1  Total GAD 7 Score 0 15 4 8   Anxiety Difficulty Not difficult at all Not difficult at all Somewhat difficult Very difficult      Physical Exam HENT:     Head: Normocephalic.  Cardiovascular:  Rate and Rhythm: Normal rate.  Pulmonary:     Effort: Pulmonary effort is normal.  Neurological:     Mental Status: He is alert and oriented to person, place, and time.  Psychiatric:        Mood and Affect: Mood normal.       Assessment & Plan:  Manuel Cortez was seen today for medical management of chronic issues.  Diagnoses and all orders for this visit:  GAD (generalized anxiety disorder) -     gabapentin  (NEURONTIN ) 100 MG capsule; Take 1 capsule (100 mg total) by mouth 3 (three) times daily. For right arm pain.  Primary insomnia -     mirtazapine  (REMERON ) 15 MG tablet; Take 1 tablet (15 mg total) by mouth at bedtime.  Anxiety and depression -     busPIRone  (BUSPAR ) 10 MG tablet; Take 1 tablet (10 mg total) by mouth 3 (three) times daily. -     clonazePAM  (KLONOPIN ) 1 MG tablet; Take one tablet twice a day as needed for anxiety.  Panic attacks -     gabapentin  (NEURONTIN ) 100 MG capsule; Take 1 capsule (100 mg total) by mouth 3 (three) times daily. For  right arm pain.  Cervical spinal stenosis, without cervical myelopathy -     gabapentin  (NEURONTIN ) 100 MG capsule; Take 1 capsule (100 mg total) by mouth 3 (three) times daily. For right arm pain.  Gastroesophageal reflux disease, unspecified whether esophagitis present -     omeprazole  (PRILOSEC) 40 MG capsule; Take 1 capsule (40 mg total) by mouth daily.   Assessment & Plan Primary insomnia Improvement with mirtazapine , but waking once nightly. Current dose effective but could be increased. - Increased mirtazapine  to 15 mg for improved sleep maintenance.  Generalized anxiety disorder and panic disorder Managed with Buspirone  and Clonazepam . Gabapentin  may help with anxiety and nerve pain. - Continue Buspirone  10 mg oral bid and consider increasing to TID.  - Continue Clonazepam  1 mg oral bid or as needed. - Encouraged consistent use of Gabapentin  100 mg oral tid.  Chronic pain due to cervical spinal stenosis Chronic pain likely related to cervical spinal stenosis. Gabapentin  may help with nerve pain. - Encouraged consistent use of Gabapentin  100 mg oral tid.  Gastroesophageal reflux disease Stomach burning possibly related to stress and inconsistent Prilosec use. - Prescribed Prilosec 40mg  daily to help protect stomach.  - Advised to maintain regular meals.  Follow up as needed if symptoms persist or worsen.      Return in about 6 months (around 04/05/2025).    Prentice Sackrider, PA-C  "

## 2024-10-06 NOTE — Patient Instructions (Signed)
 Increased buspar  to 10mg  3 times a day Continue gabapentin  100mg  3 times a day Increased remeron  15mg  at bedtime Continue klonapin 1mg  2 times a day

## 2024-10-08 ENCOUNTER — Encounter: Payer: Self-pay | Admitting: Physician Assistant

## 2024-12-06 ENCOUNTER — Other Ambulatory Visit (HOSPITAL_COMMUNITY): Payer: Self-pay

## 2024-12-06 ENCOUNTER — Telehealth: Payer: Self-pay

## 2024-12-06 NOTE — Telephone Encounter (Signed)
 Pharmacy Patient Advocate Encounter   Received notification from Physician's Office that prior authorization for klonopin  is required/requested.   Insurance verification completed.   The patient is insured through [employer].   Per test claim: Refill too soon. PA is not needed at this time. Medication was filled 12/06/24. Next eligible fill date is 12/29/24.

## 2024-12-06 NOTE — Telephone Encounter (Signed)
 Copied from CRM #[id number]. Topic: Clinical - Medication Prior Auth >> Dec 06, 2024 11:23 AM Sophia H wrote: Reason for CRM: Patients spouse called to inform clinic that patients clonazePAM  (KLONOPIN ) 1 MG tablet requires a PA. Due for refill today, please submit ASAP. TY, stated pharmacy will also be faxing over a request.  [facility] - [place], [place] - [id number]

## 2024-12-06 NOTE — Telephone Encounter (Signed)
 Would you please assist with PA for Clonazepam  1mg  for this patient.?

## 2025-04-05 ENCOUNTER — Ambulatory Visit: Admitting: Physician Assistant
